# Patient Record
Sex: Male | Born: 1960
Health system: Southern US, Community
[De-identification: ages and names within clinical notes are randomized; demographics above are authoritative.]

## PROBLEM LIST (undated history)

## (undated) DIAGNOSIS — C801 Malignant (primary) neoplasm, unspecified: Secondary | ICD-10-CM

## (undated) DIAGNOSIS — T8859XA Other complications of anesthesia, initial encounter: Secondary | ICD-10-CM

## (undated) DIAGNOSIS — T7840XA Allergy, unspecified, initial encounter: Secondary | ICD-10-CM

## (undated) DIAGNOSIS — F419 Anxiety disorder, unspecified: Secondary | ICD-10-CM

## (undated) DIAGNOSIS — T4145XA Adverse effect of unspecified anesthetic, initial encounter: Secondary | ICD-10-CM

## (undated) HISTORY — DX: Allergy, unspecified, initial encounter: T78.40XA

## (undated) HISTORY — DX: Anxiety disorder, unspecified: F41.9

---

## 2001-11-21 ENCOUNTER — Encounter: Admission: RE | Admit: 2001-11-21 | Discharge: 2001-11-21 | Payer: Self-pay | Admitting: Family Medicine

## 2001-11-21 ENCOUNTER — Encounter: Payer: Self-pay | Admitting: Family Medicine

## 2005-09-27 ENCOUNTER — Emergency Department (HOSPITAL_COMMUNITY): Admission: EM | Admit: 2005-09-27 | Discharge: 2005-09-27 | Payer: Self-pay | Admitting: *Deleted

## 2006-10-31 HISTORY — PX: PROSTATE SURGERY: SHX751

## 2007-03-13 ENCOUNTER — Ambulatory Visit (HOSPITAL_COMMUNITY): Admission: RE | Admit: 2007-03-13 | Discharge: 2007-03-13 | Payer: Self-pay | Admitting: Urology

## 2007-04-18 ENCOUNTER — Encounter (INDEPENDENT_AMBULATORY_CARE_PROVIDER_SITE_OTHER): Payer: Self-pay | Admitting: Urology

## 2007-04-18 ENCOUNTER — Inpatient Hospital Stay (HOSPITAL_COMMUNITY): Admission: RE | Admit: 2007-04-18 | Discharge: 2007-04-19 | Payer: Self-pay | Admitting: Urology

## 2007-10-14 IMAGING — CR DG CHEST 2V
3 series · 3 of 3 positions shown · non-contrast
Comparison: None.

Addendum BeginsOriginal report by Dr. Brutus.  Following addendum by Dr. Sione on 04/18/07:
CLINICAL DATA: Repeat PA chest requested.  The initial chest x-ray on 04/10/07 was not done with deep inspiration.
 PA CHEST ? 04/18/07:
CLINICAL DATA: 46-year-old, prostate cancer, preoperative respiratory exam for prostatectomy.  
 CHEST ? 2 VIEW:

[view not recorded (1 of 3)]
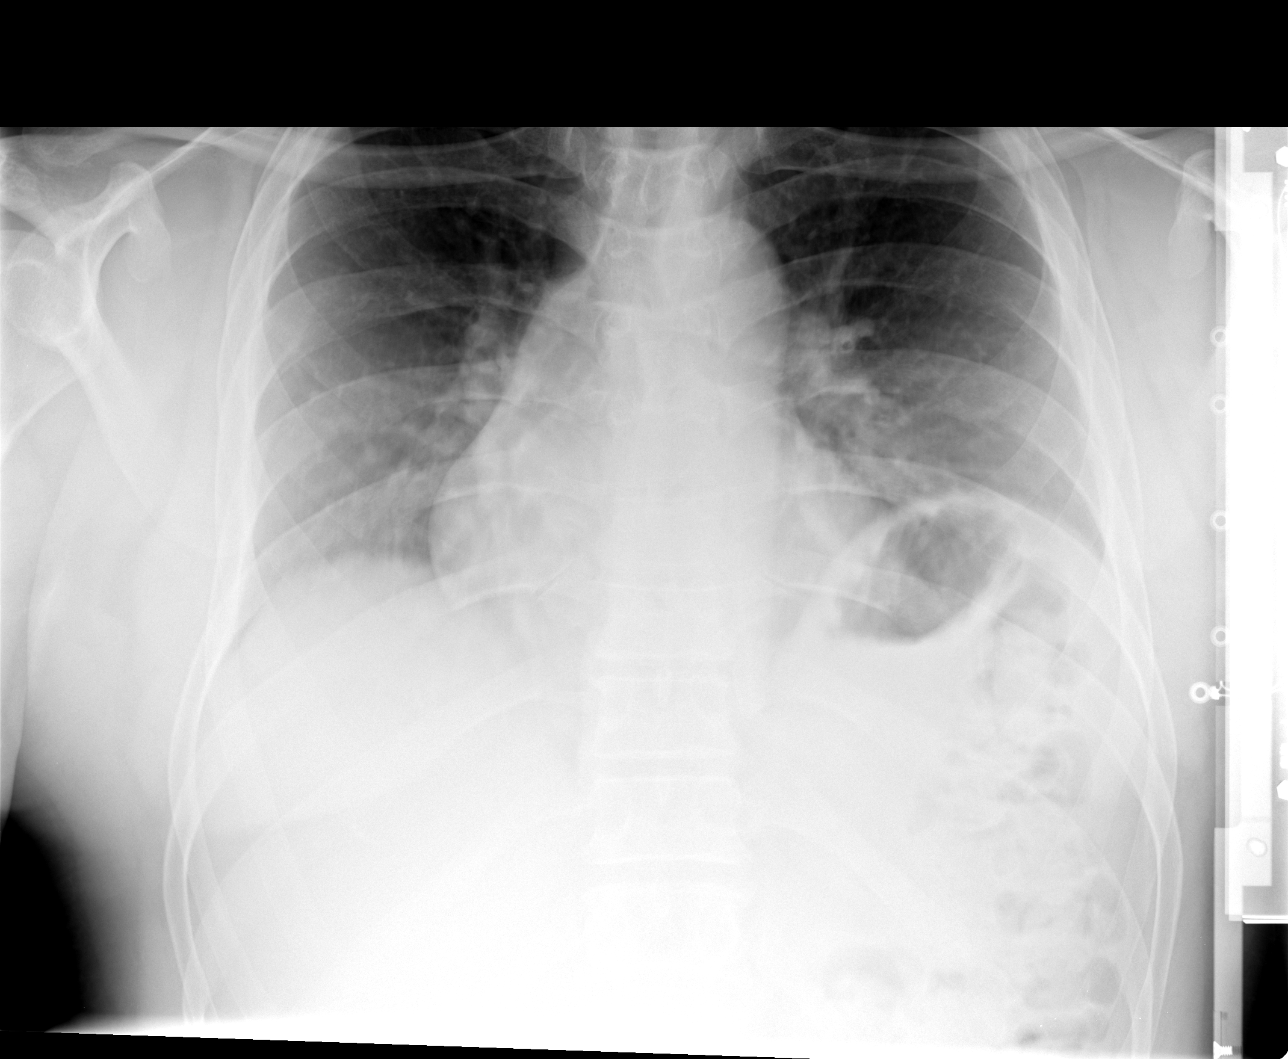

[view not recorded (2 of 3)]
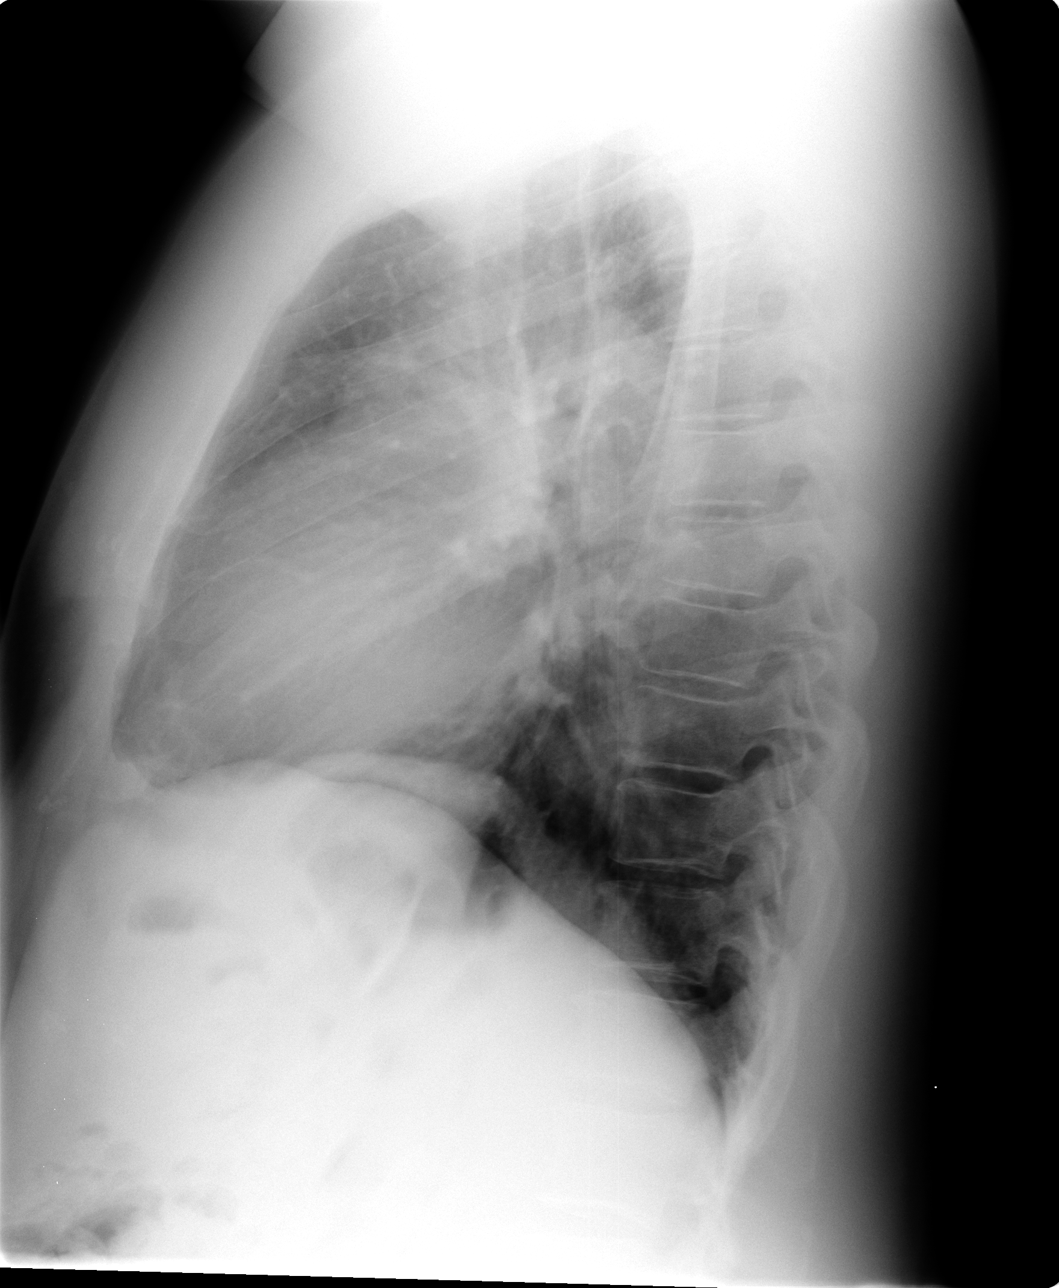

[view not recorded (3 of 3)]
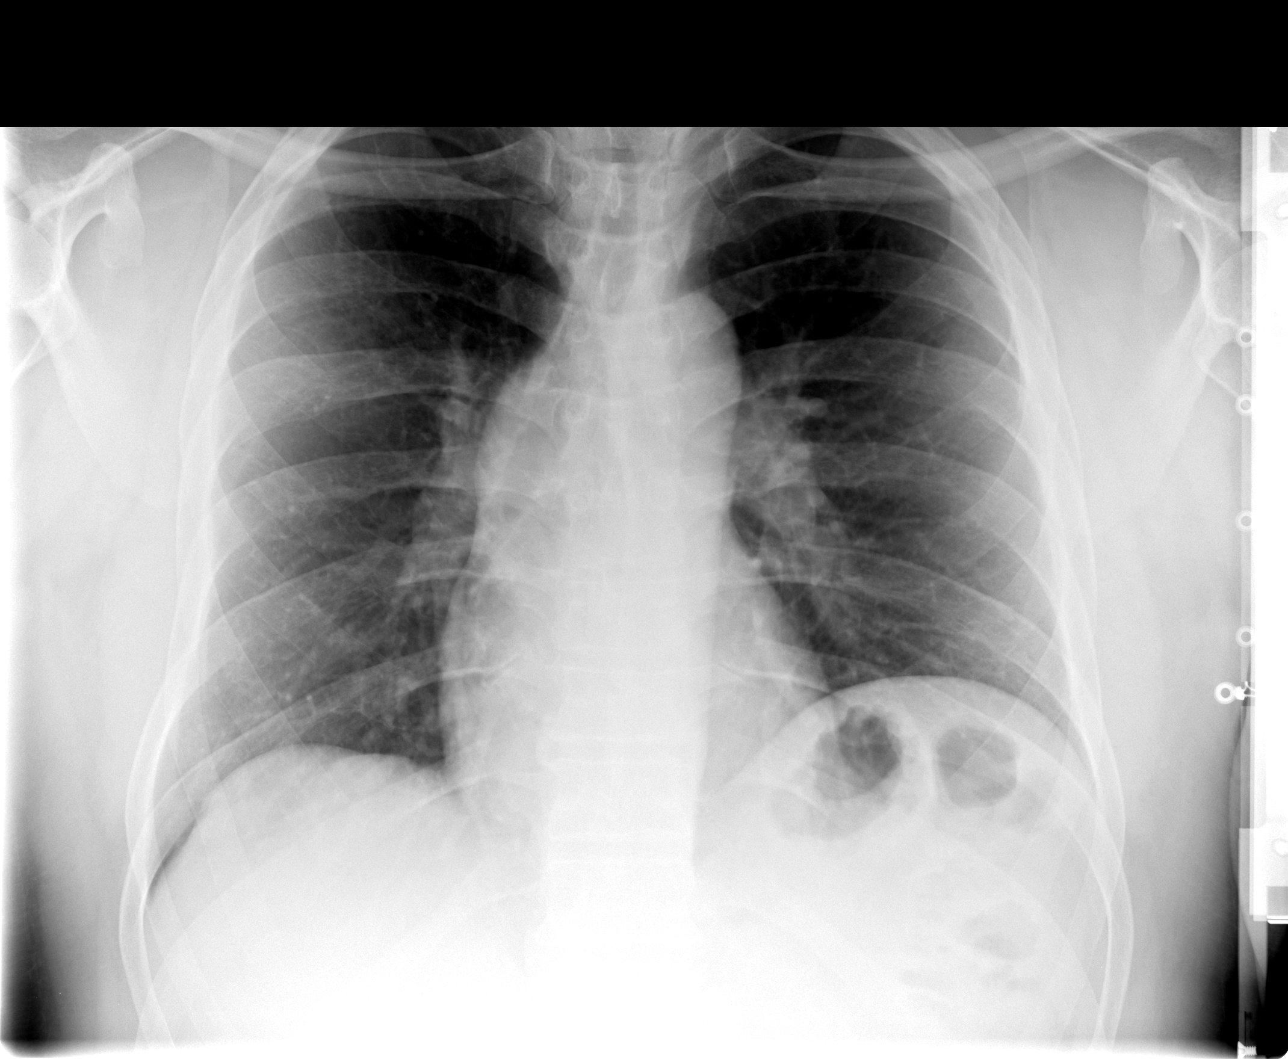

[3 of 3 positions shown; findings below may reference images not displayed]

FINDINGS: Today?s repeat PA chest was done with a better level of inspiration.  The heart and lungs are normal.  Osseous structures intact in one view.
IMPRESSION: No active disease. 

 Addendum Ends
FINDINGS: The PA film is very low volume inspiration and possibly expiration with vascular crowding and accentuation of heart size.  No significant pulmonary findings are seen on the lateral film.  Bony structures are intact.
IMPRESSION: Very low volume, possible expiratory, PA chest film.  It should be repeated.  No obvious acute pulmonary findings or masses.  No abnormalities are seen on the lateral film.

## 2008-11-12 ENCOUNTER — Emergency Department (HOSPITAL_COMMUNITY): Admission: EM | Admit: 2008-11-12 | Discharge: 2008-11-12 | Payer: Self-pay | Admitting: Emergency Medicine

## 2009-10-31 HISTORY — PX: HERNIA REPAIR: SHX51

## 2010-08-27 ENCOUNTER — Encounter (INDEPENDENT_AMBULATORY_CARE_PROVIDER_SITE_OTHER): Payer: Self-pay | Admitting: Surgery

## 2010-08-30 ENCOUNTER — Inpatient Hospital Stay (HOSPITAL_COMMUNITY): Admission: RE | Admit: 2010-08-30 | Discharge: 2010-08-30 | Payer: Self-pay | Admitting: Surgery

## 2011-01-12 LAB — GLUCOSE, CAPILLARY: Glucose-Capillary: 124 mg/dL — ABNORMAL HIGH (ref 70–99)

## 2011-01-12 LAB — BASIC METABOLIC PANEL
BUN: 10 mg/dL (ref 6–23)
BUN: 12 mg/dL (ref 6–23)
BUN: 9 mg/dL (ref 6–23)
CO2: 26 mEq/L (ref 19–32)
Calcium: 9.4 mg/dL (ref 8.4–10.5)
Chloride: 105 mEq/L (ref 96–112)
Chloride: 107 mEq/L (ref 96–112)
Creatinine, Ser: 1.15 mg/dL (ref 0.4–1.5)
Creatinine, Ser: 1.18 mg/dL (ref 0.4–1.5)
GFR calc non Af Amer: >60 mL/min (ref >60)
Glucose, Bld: 105 mg/dL — ABNORMAL HIGH (ref 70–99)
Glucose, Bld: 131 mg/dL — ABNORMAL HIGH (ref 70–99)
Glucose, Bld: 146 mg/dL — ABNORMAL HIGH (ref 70–99)
Potassium: 4.6 mEq/L (ref 3.5–5.1)

## 2011-01-12 LAB — CBC
HCT: 46 % (ref 39.0–52.0)
MCH: 31.5 pg (ref 26.0–34.0)
MCH: 31.5 pg (ref 26.0–34.0)
MCHC: 34.2 g/dL (ref 30.0–36.0)
MCHC: 34.3 g/dL (ref 30.0–36.0)
MCHC: 34.5 g/dL (ref 30.0–36.0)
MCV: 91.5 fL (ref 78.0–100.0)
MCV: 91.8 fL (ref 78.0–100.0)
Platelets: 297 10*3/uL (ref 150–400)
Platelets: 325 10*3/uL (ref 150–400)
RDW: 12.9 % (ref 11.5–15.5)
RDW: 13 % (ref 11.5–15.5)
RDW: 13.3 % (ref 11.5–15.5)
WBC: 4.2 10*3/uL (ref 4.0–10.5)
WBC: 9 10*3/uL (ref 4.0–10.5)

## 2011-01-12 LAB — CARDIAC PANEL(CRET KIN+CKTOT+MB+TROPI)
CK, MB: 2.4 ng/mL (ref 0.3–4.0)
CK, MB: 2.4 ng/mL (ref 0.3–4.0)
Relative Index: 0.7 (ref 0.0–2.5)
Relative Index: 0.9 (ref 0.0–2.5)
Troponin I: 0.01 ng/mL (ref 0.00–0.06)
Troponin I: 0.01 ng/mL (ref 0.00–0.06)

## 2011-01-12 LAB — URINALYSIS, ROUTINE W REFLEX MICROSCOPIC
Bilirubin Urine: NEGATIVE
Glucose, UA: NEGATIVE mg/dL
Ketones, ur: NEGATIVE mg/dL
Protein, ur: NEGATIVE mg/dL

## 2011-01-12 LAB — DIFFERENTIAL
Basophils Absolute: 0 10*3/uL (ref 0.0–0.1)
Basophils Relative: 1 % (ref 0–1)
Eosinophils Absolute: 0.2 10*3/uL (ref 0.0–0.7)
Eosinophils Relative: 5 % (ref 0–5)
Lymphocytes Relative: 45 % (ref 12–46)
Monocytes Absolute: 0.5 10*3/uL (ref 0.1–1.0)

## 2011-01-12 LAB — PROTIME-INR: Prothrombin Time: 13.5 seconds (ref 11.6–15.2)

## 2011-01-12 LAB — CK TOTAL AND CKMB (NOT AT ARMC)
CK, MB: 2.5 ng/mL (ref 0.3–4.0)
Total CK: 341 U/L — ABNORMAL HIGH (ref 7–232)

## 2011-01-12 LAB — SURGICAL PCR SCREEN: MRSA, PCR: NEGATIVE

## 2011-01-12 LAB — TSH: TSH: 0.799 u[IU]/mL (ref 0.350–4.500)

## 2011-02-14 LAB — POCT I-STAT, CHEM 8
Chloride: 103 mEq/L (ref 96–112)
HCT: 43 % (ref 39.0–52.0)
Hemoglobin: 14.6 g/dL (ref 13.0–17.0)
Potassium: 4.1 mEq/L (ref 3.5–5.1)
Sodium: 141 mEq/L (ref 135–145)

## 2011-03-15 NOTE — Discharge Summary (Signed)
[name], [name]               ACCOUNT NO.:  [id number]   MEDICAL RECORD NO.:  [id number]          PATIENT TYPE:  INP   LOCATION:  1405                         FACILITY:  [facility]   PHYSICIAN:  Heloise Purpura, MD      DATE OF BIRTH:  1961/08/05   DATE OF ADMISSION:  04/18/2007  DATE OF DISCHARGE:  04/19/2007                               DISCHARGE SUMMARY   ADMISSION DIAGNOSIS:  Prostate cancer.   DISCHARGE DIAGNOSIS:  Prostate cancer.   PROCEDURE:  1. Robotic assisted laparoscopic radical prostatectomy.  2. Bilateral pelvic lymphadenectomy.   HISTORY AND PHYSICAL:  For full details please see admission history and  physical.  Briefly, Mr. [name] is a 50 year old gentleman with clinical  stage T1-2 prostate cancer with a PSA of 6.1 and Gleason score 3 + 4  equals seven.  His metastatic evaluation was negative and after  discussing management options for clinically localized prostate cancer,  he elected to proceed with the above procedure.   HOSPITAL COURSE:  On 04/18/2007, the patient underwent a robotic  assisted laparoscopic radical prostatectomy.  He tolerated this  procedure well without complications.  Postoperatively, he was able to  be transferred to a regular hospital room following recovery from  anesthesia.  He was able to begin ambulating the night of surgery.  He  remained hemodynamically stable the first evening following surgery and  his hemoglobin remained stable at 13.7 on postoperative day #1.  He  maintained excellent urine output with minimal output from his pelvic  drain.  His pelvic drain was therefore removed.  He was begun on a clear  liquid diet which he tolerated without difficulty and was therefore  transitioned to oral pain medication..  By the afternoon of  postoperative day #1, he had met all discharge criteria and was  discharged home in excellent condition.   DISPOSITION:  Home.   DISCHARGE MEDICATIONS:  The patient was given a prescription to  take  Vicodin as needed for pain, Colace as a stool softener, and Cipro to  begin 1 day prior to his return visit for Foley catheter removal.   DISCHARGE INSTRUCTIONS:  The patient was instructed to be ambulatory but  specifically told to refrain from any heavy lifting, strenuous activity,  or driving.  He was told to gradually advance his diet once passing  flatus.  He was also given instruction on routine Foley catheter care.   FOLLOW UP:  Mr. [name] will follow-up in 1 week for removal of Foley  catheter and to discuss his surgical pathology in detail.           ______________________________  Heloise Purpura, MD  Electronically Signed     LB/MEDQ  D:  04/19/2007  T:  04/19/2007  Job:  [id number]

## 2011-03-15 NOTE — Op Note (Signed)
[name], [name]               ACCOUNT NO.:  [id number]   MEDICAL RECORD NO.:  [id number]          PATIENT TYPE:  INP   LOCATION:  1405                         FACILITY:  [facility]   PHYSICIAN:  Heloise Purpura, MD      DATE OF BIRTH:  1961-06-28   DATE OF PROCEDURE:  04/18/2007  DATE OF DISCHARGE:                               OPERATIVE REPORT   PREOPERATIVE DIAGNOSIS:  Clinically localized adenocarcinoma of  prostate.   POSTOPERATIVE DIAGNOSIS:  Clinically localized adenocarcinoma of  prostate.   PROCEDURES:  1. Robotic assisted laparoscopic radical prostatectomy (bilateral      nerve sparing).  2. Bilateral pelvic lymphadenectomy.   SURGEON:  Dr. Heloise Purpura.   ASSISTANT:  Dr. Terie Purser.   ANESTHESIA:  General.   COMPLICATIONS:  None.   ESTIMATED BLOOD LOSS:  250 mL.   INTRAVENOUS FLUIDS:  1600 mL of lactated Ringer's.   SPECIMENS:  1. Prostate and seminal vesicles.  2. Right pelvic lymph nodes.  3. Left pelvic lymph nodes.   DISPOSITION:  Specimen to pathology.   DRAINS:  1. 20-French straight catheter.  2. #19 Blake pelvic drain.   INDICATIONS:  [name] is a 50 year old gentleman with clinical stage  T1-2 prostate cancer with a PSA of 6.1 Gleason score of 3+4=7.  He did  undergo metastatic evaluation under the care of Dr. Patsi Sears including  a bone scan and CT scan which were negative for metastatic disease.  His  pretreatment IIEF score is 23 with an IPSS 2.  After discussing  management options for clinically localized prostate cancer, the patient  elected to proceed with surgical removal and the procedures above.  Potential risks and benefits as well as complications and alternative  options were discussed with the patient and informed consent was  obtained.   DESCRIPTION OF PROCEDURE:  The patient is taken to the operating room  and a general anesthetic was administered.  He was given preoperative  antibiotics, placed in the dorsal lithotomy  position, prepped and draped  in usual sterile fashion.  Next preoperative time-out was performed.  A  Foley catheter was then inserted into the bladder.  A site was selected  18 cm from the pubic symphysis and just to left the umbilicus for  placement of the camera port.  This was placed using a standard open  Hassan technique.  This allowed entry to the peritoneal cavity under  direct vision without difficulty.  A 12 mm port was then placed and a  pneumoperitoneum was established.  The 0 degree lens was then used to  inspect the abdomen there was no evidence of any intra-abdominal  injuries or other abnormalities.  The remaining ports were then placed.  Bilateral 8 mm robotic ports were placed 10 cm lateral to the camera  port just inferior to the camera port.  An additional 8 mm robotic port  was placed in the far left lateral abdominal wall.  A 5 mm port was  placed between the camera port and the right robotic port and an  additional 12 mm port was placed in the far  right lateral abdominal  port.  All ports were placed under direct vision and without difficulty.  The surgical cart was then docked.  With the aid of cautery scissors,  the bladder was reflected posteriorly allowing entry in the space of  Retzius and identification of the endopelvic fascia and prostate.  The  endopelvic fascia was then incised from the apex back to the base of  prostate bilaterally and the underlying levator muscle fibers were swept  laterally off the prostate.  This isolated the dorsal venous complex  which was then stapled and divided with a 45 mm Flex ETS stapler.  The  bladder neck was then identified with the aid of Foley catheter  manipulation and divided anteriorly.  This allowed exposure of the Foley  catheter and the catheter balloon was deflated.  The catheter was  brought into the operative field and used to retract the prostate  anteriorly.  This exposed the posterior bladder neck which was  then  incised and dissection continued posteriorly between the bladder neck  and prostate until the vasa deferentia and seminal vesicles were  identified.  Vasa deferentia were isolated and divided and then lifted  anteriorly.  Seminal vesicles were dissected down to their tips where  the seminal vesicle arterial blood supply was controlled.  The seminal  vesicles were then lifted anteriorly.  The space between Denonvilliers'  fascia and the anterior rectum was bluntly developed thereby isolating  the vascular pedicles of the prostate.  The lateral prostatic fascia was  incised bilaterally allowing the neurovascular bundles to be swept  laterally and posteriorly off the prostate.  The vascular pedicles of  prostate was then ligated above the level of the neurovascular bundles  with Hem-o-lok clips and divided with sharp cold scissor dissection.  The neurovascular bundles were then swept off the apex of the prostate  and urethra.  The urethra sharply divided allowing the prostate specimen  to be disarticulated.  The pelvis was then copiously irrigated and there  was noted to be a venous bleeding site toward the distal aspect of the  left neurovascular bundle.  A piece of Surgicel was placed over this  area while the lymphadenectomy was performed.  There was no evidence of  a rectal injury and otherwise hemostasis was excellent.  Attention  turned to the right pelvic sidewall and the fibrofatty tissue between  the external iliac vein, confluence of the iliac vessels, internal iliac  artery, and Cooper's ligament was dissected free from the pelvic  sidewall with care to preserve the obturator nerve.  Hem-o-lok clips  were used for lymphostasis and hemostasis.  This lymphatic packet was  then passed off for permanent pathologic analysis.  An identical  procedure was then performed on the contralateral side.  Attention then turned to the urethral anastomosis.  2-0 Vicryl slip-knot was placed   between the posterior urethra, Denonvilliers' fascia and the bladder  neck to reapproximate these structures.  A double-armed 3-0 Monocryl  suture was then used to perform a 360 degrees running tension-free  anastomosis between the bladder neck and urethra.  A 20-French coude  catheter was then inserted into the bladder and irrigated.  There no  blood clots within the bladder and the anastomosis appeared to be  watertight.  A #19 Blake drain was then brought through the left robotic  port appropriately positioned in the pelvis.  It was secured to skin  with a nylon suture.  The surgical cart was then undocked the right  lateral 12 mm port site was closed with a 0-0 Vicryl suture placed with  the aid of the suture passer device.  The prostate specimen was then  removed intact within the Endopouch retrieval bag via the periumbilical  port site.  This fascial opening was closed with a running 0-0 Vicryl  suture.  All port sites were then injected with quarter percent  Marcaine.  The remaining ports had been removed under direct vision  previously.  All  incisions were then closed at the skin level with staples and sterile  dressings were applied.  The patient appeared tolerate the procedure  well without complications.  He was able to be extubated and transferred  to recovery in satisfactory condition.           ______________________________  Heloise Purpura, MD  Electronically Signed     LB/MEDQ  D:  04/18/2007  T:  04/19/2007  Job:  [id number]

## 2011-08-17 LAB — HEMOGLOBIN AND HEMATOCRIT, BLOOD: Hemoglobin: 13.7

## 2011-08-17 LAB — ABO/RH: ABO/RH(D): O POS

## 2011-08-17 LAB — TYPE AND SCREEN
ABO/RH(D): O POS
Antibody Screen: NEGATIVE

## 2011-08-18 LAB — CBC
MCHC: 33.9
MCV: 89.4
Platelets: 295
RDW: 13.3

## 2011-08-18 LAB — BASIC METABOLIC PANEL
CO2: 30
Chloride: 107
GFR calc non Af Amer: >60
Glucose, Bld: 58 — ABNORMAL LOW
Potassium: 3.7
Sodium: 143

## 2014-05-11 ENCOUNTER — Encounter (HOSPITAL_COMMUNITY): Payer: Self-pay | Admitting: Emergency Medicine

## 2014-05-11 ENCOUNTER — Emergency Department (HOSPITAL_COMMUNITY)
Admission: EM | Admit: 2014-05-11 | Discharge: 2014-05-11 | Disposition: A | Discharge status: Home / Self Care | Payer: BC Managed Care – PPO | Attending: Emergency Medicine | Admitting: Emergency Medicine

## 2014-05-11 DIAGNOSIS — Z79899 Other long term (current) drug therapy: Secondary | ICD-10-CM | POA: Insufficient documentation

## 2014-05-11 DIAGNOSIS — R0981 Nasal congestion: Secondary | ICD-10-CM

## 2014-05-11 DIAGNOSIS — J3489 Other specified disorders of nose and nasal sinuses: Secondary | ICD-10-CM | POA: Insufficient documentation

## 2014-05-11 MED ORDER — PSEUDOEPHEDRINE HCL 30 MG/5ML PO SYRP
15.0000 mg | ORAL_SOLUTION | Freq: Three times a day (TID) | ORAL | Status: DC | PRN
Start: 2014-05-11 — End: 2016-01-11

## 2014-05-11 MED ORDER — FLUTICASONE PROPIONATE 50 MCG/ACT NA SUSP: 1.0000 | Freq: Every day | NASAL | Status: DC

## 2014-05-11 NOTE — ED Provider Notes (Signed)
CSN: [id number]     Arrival date & time 05/11/14  8416 History   First MD Initiated Contact with Patient 05/11/14 0559     Chief Complaint  Patient presents with  . Nasal Congestion     (Consider location/radiation/quality/duration/timing/severity/associated sxs/prior Treatment) HPI Comments: Patient presents to the emergency department with chief complaint of nasal congestion. He states that the congestion started last night. He complains of difficulty breathing through his nose. He denies any fevers, chills, nausea, vomiting, chest pain, shortness of breath, or abdominal pain. He has tried taking allergy medicine with no relief. There are no aggravating or alleviating factors. He denies any sick contacts.  The history is provided by the patient. No language interpreter was used.    History reviewed. No pertinent past medical history. History reviewed. No pertinent past surgical history. No family history on file. History  Substance Use Topics  . Smoking status: Never Smoker   . Smokeless tobacco: Not on file  . Alcohol Use: No    Review of Systems  Constitutional: Negative for fever and chills.  HENT: Positive for postnasal drip and sinus pressure.   Respiratory: Negative for shortness of breath.   Cardiovascular: Negative for chest pain.  Gastrointestinal: Negative for nausea, vomiting, diarrhea and constipation.  Genitourinary: Negative for dysuria.  All other systems reviewed and are negative.     Allergies  Review of patient's allergies indicates no known allergies.  Home Medications   Prior to Admission medications   Medication Sig Start Date End Date Taking? Authorizing Provider  Chlorphen-Pseudoephed-APAP (TYLENOL ALLERGY COMPLETE PO) Take 2 tablets by mouth daily as needed (cold).   Yes Historical Provider, MD   BP 137/97  Pulse 63  Temp(Src) 97.7 F (36.5 C)  Resp 19  Ht 6\' 3"  (1.905 m)  Wt 254 lb (115.214 kg)  BMI 31.75 kg/m2  SpO2 98% Physical Exam   Nursing note and vitals reviewed. Constitutional: He is oriented to person, place, and time. He appears well-developed and well-nourished.  HENT:  Head: Normocephalic and atraumatic.  No sinus pressure, oropharynx clear, no exudates, bilateral cerumen impaction  Eyes: Conjunctivae and EOM are normal. Pupils are equal, round, and reactive to light. Right eye exhibits no discharge. Left eye exhibits no discharge. No scleral icterus.  Neck: Normal range of motion. Neck supple. No JVD present.  Cardiovascular: Normal rate, regular rhythm and normal heart sounds.  Exam reveals no gallop and no friction rub.   No murmur heard. Pulmonary/Chest: Effort normal and breath sounds normal. No respiratory distress. He has no wheezes. He has no rales. He exhibits no tenderness.  CTAB  Abdominal: Soft. He exhibits no distension and no mass. There is no tenderness. There is no rebound and no guarding.  No focal abdominal tenderness, no RLQ tenderness or pain at McBurney's point, no RUQ tenderness or Murphy's sign, no left-sided abdominal tenderness, no fluid wave, or signs of peritonitis   Musculoskeletal: Normal range of motion. He exhibits no edema and no tenderness.  Neurological: He is alert and oriented to person, place, and time.  Skin: Skin is warm and dry.  Psychiatric: He has a normal mood and affect. His behavior is normal. Judgment and thought content normal.    ED Course  Procedures (including critical care time) Labs Review Labs Reviewed - No data to display  Imaging Review No results found.   EKG Interpretation None      MDM   Final diagnoses:  Nasal congestion    Patient with nasal  congestion. No fevers. Lungs are clear. Not in any apparent distress. Will try nasal decongestant and Flonase.  Encouraged careful monitoring of BP while taking sudafed. DC home.  Filed Vitals:   05/11/14 0618  BP: 137/97  Pulse: 63  Temp:   Resp: [address], [place] 05/11/14  [phone]

## 2014-05-11 NOTE — ED Notes (Signed)
The pt is c/o nasal congestion and chest congestion since 2200 .  He reports he cannot breathe through his nose

## 2014-05-11 NOTE — Discharge Instructions (Signed)
Sudafed may cause slight to moderate increase in blood pressure.  Please monitor your blood pressure carefully.  Do not take the sudafed for more than 5 days.  If you notice a significant increase in your blood pressure, please discontinue use of the sudafed.  Upper Respiratory Infection, Adult An upper respiratory infection (URI) is also sometimes known as the common cold. The upper respiratory tract includes the nose, sinuses, throat, trachea, and bronchi. Bronchi are the airways leading to the lungs. Most people improve within 1 week, but symptoms can last up to 2 weeks. A residual cough may last even longer.  CAUSES Many different viruses can infect the tissues lining the upper respiratory tract. The tissues become irritated and inflamed and often become very moist. Mucus production is also common. A cold is contagious. You can easily spread the virus to others by oral contact. This includes kissing, sharing a glass, coughing, or sneezing. Touching your mouth or nose and then touching a surface, which is then touched by another person, can also spread the virus. SYMPTOMS  Symptoms typically develop 1 to 3 days after you come in contact with a cold virus. Symptoms vary from person to person. They may include:  Runny nose.  Sneezing.  Nasal congestion.  Sinus irritation.  Sore throat.  Loss of voice (laryngitis).  Cough.  Fatigue.  Muscle aches.  Loss of appetite.  Headache.  Low-grade fever. DIAGNOSIS  You might diagnose your own cold based on familiar symptoms, since most people get a cold 2 to 3 times a year. Your caregiver can confirm this based on your exam. Most importantly, your caregiver can check that your symptoms are not due to another disease such as strep throat, sinusitis, pneumonia, asthma, or epiglottitis. Blood tests, throat tests, and X-rays are not necessary to diagnose a common cold, but they may sometimes be helpful in excluding other more serious diseases.  Your caregiver will decide if any further tests are required. RISKS AND COMPLICATIONS  You may be at risk for a more severe case of the common cold if you smoke cigarettes, have chronic heart disease (such as heart failure) or lung disease (such as asthma), or if you have a weakened immune system. The very young and very old are also at risk for more serious infections. Bacterial sinusitis, middle ear infections, and bacterial pneumonia can complicate the common cold. The common cold can worsen asthma and chronic obstructive pulmonary disease (COPD). Sometimes, these complications can require emergency medical care and may be life-threatening. PREVENTION  The best way to protect against getting a cold is to practice good hygiene. Avoid oral or hand contact with people with cold symptoms. Wash your hands often if contact occurs. There is no clear evidence that vitamin C, vitamin E, echinacea, or exercise reduces the chance of developing a cold. However, it is always recommended to get plenty of rest and practice good nutrition. TREATMENT  Treatment is directed at relieving symptoms. There is no cure. Antibiotics are not effective, because the infection is caused by a virus, not by bacteria. Treatment may include:  Increased fluid intake. Sports drinks offer valuable electrolytes, sugars, and fluids.  Breathing heated mist or steam (vaporizer or shower).  Eating chicken soup or other clear broths, and maintaining good nutrition.  Getting plenty of rest.  Using gargles or lozenges for comfort.  Controlling fevers with ibuprofen or acetaminophen as directed by your caregiver.  Increasing usage of your inhaler if you have asthma. Zinc gel and zinc lozenges,  taken in the first 24 hours of the common cold, can shorten the duration and lessen the severity of symptoms. Pain medicines may help with fever, muscle aches, and throat pain. A variety of non-prescription medicines are available to treat  congestion and runny nose. Your caregiver can make recommendations and may suggest nasal or lung inhalers for other symptoms.  HOME CARE INSTRUCTIONS   Only take over-the-counter or prescription medicines for pain, discomfort, or fever as directed by your caregiver.  Use a warm mist humidifier or inhale steam from a shower to increase air moisture. This may keep secretions moist and make it easier to breathe.  Drink enough water and fluids to keep your urine clear or pale yellow.  Rest as needed.  Return to work when your temperature has returned to normal or as your caregiver advises. You may need to stay home longer to avoid infecting others. You can also use a face mask and careful hand washing to prevent spread of the virus. SEEK MEDICAL CARE IF:   After the first few days, you feel you are getting worse rather than better.  You need your caregiver's advice about medicines to control symptoms.  You develop chills, worsening shortness of breath, or brown or red sputum. These may be signs of pneumonia.  You develop yellow or brown nasal discharge or pain in the face, especially when you bend forward. These may be signs of sinusitis.  You develop a fever, swollen neck glands, pain with swallowing, or white areas in the back of your throat. These may be signs of strep throat. SEEK IMMEDIATE MEDICAL CARE IF:   You have a fever.  You develop severe or persistent headache, ear pain, sinus pain, or chest pain.  You develop wheezing, a prolonged cough, cough up blood, or have a change in your usual mucus (if you have chronic lung disease).  You develop sore muscles or a stiff neck. Document Released: 04/12/2001 Document Revised: 01/09/2012 Document Reviewed: 02/18/2011 [facility] Patient Information 2015 [place], [place]. This information is not intended to replace advice given to you by your health care provider. Make sure you discuss any questions you have with your health care  provider. Pseudoephedrine oral solution or syrup What is this medicine? PSEUDOEPHEDRINE (soo doe e FED rin) is a decongestant. It is used to treat congestion of the nose or sinuses. This medicine may be used for other purposes; ask your health care provider or pharmacist if you have questions. COMMON BRAND NAME(S): ElixSure Cold, ElixSure Congestion, Myfedrine, NASAL Decongestant, Silfedrine, Sudafed Children's, Sudafed Children's Nasal Decongestant, Sudogest Children's, Tylenol Children's Simply Stuffy What should I tell my health care provider before I take this medicine? They need to know if you have any of the following conditions: -diabetes -glaucoma -heart disease -high blood pressure -kidney disease -prostate trouble -taken an MAOI like Carbex, Eldepryl, Marplan, Nardil, or Parnate in last 14 days -thyroid disease -trouble passing urine -an unusual or allergic reaction to pseudoephedrine, other medicines, foods, dyes, or preservatives -pregnant or trying to get pregnant -breast-feeding How should I use this medicine? Take this medicine by mouth. Follow the directions on the prescription label. Use a specially marked spoon or container to measure each dose. Ask your pharmacist if you do not have one. Household spoons are not accurate. Take your medicine at regular intervals. Do not take your medicine more often than directed. Talk to your pediatrician regarding the use of this medicine in children. While this drug may be prescribed for children as  young as 74 years old for selected conditions, precautions do apply. Patients over 72 years old may have a stronger reaction and need a smaller dose. Overdosage: If you think you have taken too much of this medicine contact a poison control center or emergency room at once. NOTE: This medicine is only for you. Do not share this medicine with others. What if I miss a dose? If you miss a dose, take it as soon as you can. If it is almost time  for your next dose, take only that dose. Do not take double or extra doses. What may interact with this medicine? Do not take this medicine with any of the following medications: -bromocriptine -ergot alkaloids like dihydroergotamine, ergonovine, ergotamine, methylergonovine -MAOIs like Carbex, Eldepryl, Marplan, Nardil, and Parnate -stimulant medicines for attention disorders, weight loss, or to stay awake This medicine may also interact with the following medications: -alcohol -atropine -bretylium -caffeine -digoxin -linezolid -mecamylamine -medicines for blood pressure -medicines for depression, anxiety, or psychotic disturbances like fluoxetine, sertraline -medicines for enlarged prostate -medicines for sleep -other medicines for cold, cough, or allergy -procarbazine -reserpine -some heart medicines like metoprolol -St. John's Wort This list may not describe all possible interactions. Give your health care provider a list of all the medicines, herbs, non-prescription drugs, or dietary supplements you use. Also tell them if you smoke, drink alcohol, or use illegal drugs. Some items may interact with your medicine. What should I watch for while using this medicine? Tell your doctor or healthcare professional if your symptoms do not start to get better or if they get worse. See your doctor if you are not better in 7 days or if you have a fever. What side effects may I notice from receiving this medicine? Side effects that you should report to your doctor or health care professional as soon as possible: -allergic reactions like skin rash, itching or hives, swelling of the face, lips, or tongue -bloody diarrhea with stomach pain -breathing problems -chest pain -confused, agitated, nervous -fast, irregular heartbeat -feeling faint or lightheaded, falls -hallucinations -high blood pressure -pain, tingling, numbness in the hands or feet -trouble passing urine or change in the  amount of urine -trouble sleeping Side effects that usually do not require medical attention (report to your doctor or health care professional if they continue or are bothersome): -headache -loss of appetite -nausea, stomach upset This list may not describe all possible side effects. Call your doctor for medical advice about side effects. You may report side effects to FDA at 1-800-FDA-1088. Where should I keep my medicine? Keep out of the reach of children. Store at room temperature between 15 and 25 degrees C (59 and 77 degrees F). Protect from heat and moisture. Throw away any unused medicine after the expiration date. NOTE: This sheet is a summary. It may not cover all possible information. If you have questions about this medicine, talk to your doctor, pharmacist, or health care provider.  2015, [employer]. (2008-05-19 15:40:52)

## 2014-05-11 NOTE — ED Notes (Signed)
Pt alert and oriented at discharge.  Pt refused to be taking to the waiting room in a wheelchair, ambulatory with no assistance at discharge.  Family driving pt home.

## 2014-05-19 NOTE — ED Provider Notes (Signed)
Medical screening examination/treatment/procedure(s) were performed by non-physician practitioner and as supervising physician I was immediately available for consultation/collaboration.   EKG Interpretation None        Elyn Peers, MD 05/19/14 [phone]

## 2014-11-04 ENCOUNTER — Ambulatory Visit: Admission: RE | Admit: 2014-11-04 | Payer: BC Managed Care – PPO | Source: Ambulatory Visit

## 2014-11-04 ENCOUNTER — Other Ambulatory Visit: Payer: Self-pay | Admitting: Internal Medicine

## 2014-11-04 DIAGNOSIS — M545 Low back pain, unspecified: Secondary | ICD-10-CM

## 2014-11-04 DIAGNOSIS — IMO0001 Reserved for inherently not codable concepts without codable children: Secondary | ICD-10-CM

## 2014-11-05 ENCOUNTER — Other Ambulatory Visit: Payer: Self-pay | Admitting: Internal Medicine

## 2014-11-05 ENCOUNTER — Ambulatory Visit
Admission: RE | Admit: 2014-11-05 | Discharge: 2014-11-05 | Disposition: A | Discharge status: Home / Self Care | Payer: BC Managed Care – PPO | Source: Ambulatory Visit | Attending: Internal Medicine | Admitting: Internal Medicine

## 2014-11-05 DIAGNOSIS — M545 Low back pain, unspecified: Secondary | ICD-10-CM

## 2014-11-05 DIAGNOSIS — M546 Pain in thoracic spine: Secondary | ICD-10-CM

## 2014-11-05 DIAGNOSIS — M542 Cervicalgia: Secondary | ICD-10-CM

## 2014-11-05 DIAGNOSIS — IMO0001 Reserved for inherently not codable concepts without codable children: Secondary | ICD-10-CM
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
## 2015-12-07 ENCOUNTER — Other Ambulatory Visit: Payer: Self-pay | Admitting: Surgery

## 2015-12-07 NOTE — H&P (Signed)
[name] 12/07/2015 3:48 PM Location: [facility] Patient #: [id number] DOB: Oct 27, 1961 Married / Language: English / Race: Black or African American Male  History of Present Illness Curtis Hector MD; 12/07/2015 5:24 PM) Patient words: RIH.  The patient is a 55 year old male who presents with an inguinal hernia. Note for "Inguinal hernia": Patient sent for surgical consultation by [facility] for concern of RIGHT inguinal hernia.  Pleasant active male. Status post robotic prostatectomy in 2008. Also open RIGHT inguinal hernia repair with mesh and open umbilical hernia repair with mesh by Dr. Harlow Asa October 2011. Laparoscopic camera by new onset atrial fibrillation that required amiodarone infusion. Follow by Dr. Terrence Dupont. Apparently that is now resolved and he is no longer any anticoagulation aside from aspirin. Sounds like he may tend to declined aggressive medications or interventions.  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 No groin pop after doing some heavy lifting. It is been small and reducible until more recently. Its become larger and more painful. Point where it hurts to lift after an hour at work. He has a bowel movement every day. Normally can walk a half hour without difficulty the RIGHT groin. It has started to bother him much more. Because of that, he wished to reconsider surgical intervention. His wife agreed.   Other Problems [name], CMA; 12/07/2015 3:48 PM) Inguinal Hernia Prostate Cancer Umbilical Hernia Repair  Past Surgical History [name], CMA; 12/07/2015 3:48 PM) Laparoscopic Inguinal Hernia Surgery Left. Prostate Surgery - Removal TURP  Diagnostic Studies History [name], CMA; 12/07/2015 3:48 PM) Colonoscopy never  Allergies [name], CMA; 12/07/2015 3:49 PM) No Known Drug Allergies 12/07/2015  Medication History [name], CMA; 12/07/2015  3:49 PM) No Current Medications Medications Reconciled  Social History [name], CMA; 12/07/2015 3:48 PM) Alcohol use Remotely quit alcohol use. Caffeine use Carbonated beverages, Coffee, Tea. No drug use Tobacco use Never smoker.  Family History [name], [place]; 12/07/2015 3:48 PM) Alcohol Abuse Father. Breast Cancer Mother.     Review of Systems [name] CMA; 12/07/2015 3:48 PM) General Not Present- Appetite Loss, Chills, Fatigue, Fever, Night Sweats, Weight Gain and Weight Loss. Skin Not Present- Change in Wart/Mole, Dryness, Hives, Jaundice, New Lesions, Non-Healing Wounds, Rash and Ulcer. HEENT Present- Seasonal Allergies. Not Present- Earache, Hearing Loss, Hoarseness, Nose Bleed, Oral Ulcers, Ringing in the Ears, Sinus Pain, Sore Throat, Visual Disturbances, Wears glasses/contact lenses and Yellow Eyes. Respiratory Not Present- Bloody sputum, Chronic Cough, Difficulty Breathing, Snoring and Wheezing. Breast Not Present- Breast Mass, Breast Pain, Nipple Discharge and Skin Changes. Cardiovascular Not Present- Chest Pain, Difficulty Breathing Lying Down, Leg Cramps, Palpitations, Rapid Heart Rate, Shortness of Breath and Swelling of Extremities. Gastrointestinal Not Present- Abdominal Pain, Bloating, Bloody Stool, Change in Bowel Habits, Chronic diarrhea, Constipation, Difficulty Swallowing, Excessive gas, Gets full quickly at meals, Hemorrhoids, Indigestion, Nausea, Rectal Pain and Vomiting. Male Genitourinary Not Present- Blood in Urine, Change in Urinary Stream, Frequency, Impotence, Nocturia, Painful Urination, Urgency and Urine Leakage. Musculoskeletal Not Present- Back Pain, Joint Pain, Joint Stiffness, Muscle Pain, Muscle Weakness and Swelling of Extremities. Neurological Not Present- Decreased Memory, Fainting, Headaches, Numbness, Seizures, Tingling, Tremor, Trouble walking and Weakness. Psychiatric Not Present- Anxiety, Bipolar, Change in Sleep Pattern,  Depression, Fearful and Frequent crying. Endocrine Not Present- Cold Intolerance, Excessive Hunger, Hair Changes, Heat Intolerance, Hot flashes and New Diabetes. Hematology Not Present- Easy Bruising, Excessive bleeding, Gland problems, HIV  and Persistent Infections.  Vitals ([name] CMA; 12/07/2015 3:49 PM) 12/07/2015 3:49 PM Weight: 271 lb Height: 75in Body Surface Area: 2.5 m Body Mass Index: 33.87 kg/m  Pulse: 83 (Regular)  BP: 164/82 (Sitting, Left Arm, Standard)      Physical Exam Curtis Hector MD; 12/07/2015 4:46 PM)  General Mental Status-Alert. General Appearance-Not in acute distress, Not Sickly. Orientation-Oriented X3. Hydration-Well hydrated. Voice-Normal.  Integumentary Global Assessment Upon inspection and palpation of skin surfaces of the - Axillae: non-tender, no inflammation or ulceration, no drainage. and Distribution of scalp and body hair is normal. General Characteristics Temperature - normal warmth is noted.  Head and Neck Head-normocephalic, atraumatic with no lesions or palpable masses. Face Global Assessment - atraumatic, no absence of expression. Neck Global Assessment - no abnormal movements, no bruit auscultated on the right, no bruit auscultated on the left, no decreased range of motion, non-tender. Trachea-midline. Thyroid Gland Characteristics - non-tender.  Eye Eyeball - Left-Extraocular movements intact, No Nystagmus. Eyeball - Right-Extraocular movements intact, No Nystagmus. Cornea - Left-No Hazy. Cornea - Right-No Hazy. Sclera/Conjunctiva - Left-No scleral icterus, No Discharge. Sclera/Conjunctiva - Right-No scleral icterus, No Discharge. Pupil - Left-Direct reaction to light normal. Pupil - Right-Direct reaction to light normal.  ENMT Ears Pinna - Left - no drainage observed, no generalized tenderness observed. Right - no drainage observed, no generalized tenderness observed. Nose and  Sinuses External Inspection of the Nose - no destructive lesion observed. Inspection of the nares - Left - quiet respiration. Right - quiet respiration. Mouth and Throat Lips - Upper Lip - no fissures observed, no pallor noted. Lower Lip - no fissures observed, no pallor noted. Nasopharynx - no discharge present. Oral Cavity/Oropharynx - Tongue - no dryness observed. Oral Mucosa - no cyanosis observed. Hypopharynx - no evidence of airway distress observed.  Chest and Lung Exam Inspection Movements - Normal and Symmetrical. Accessory muscles - No use of accessory muscles in breathing. Palpation Palpation of the chest reveals - Non-tender. Auscultation Breath sounds - Normal and Clear.  Cardiovascular Auscultation Rhythm - Regular. Murmurs & Other Heart Sounds - Auscultation of the heart reveals - No Murmurs and No Systolic Clicks.  Abdomen Inspection Inspection of the abdomen reveals - No Visible peristalsis and No Abnormal pulsations. Umbilicus - No Bleeding, No Urine drainage. Palpation/Percussion Palpation and Percussion of the abdomen reveal - Soft, Non Tender, No Rebound tenderness, No Rigidity (guarding) and No Cutaneous hyperesthesia. Note: Overweight but soft. No diastases. Infraumbilical transverse incision consistent with open hernia repair with mesh.  Male Genitourinary Sexual Maturity Tanner 5 - Adult hair pattern and Adult penile size and shape. Note: Definite RIGHT groin bulge to upper scrotum consistent with moderate RIGHT inguinal hernia. Reducible. Bowel sounds. Impulse at LEFT groin suspicious for possible recurrent LEFT inguinal hernia as well versus old spermatic cord lipoma.  Testes, epididymides, spermatic cords were within normal limits. Circumcised.  Peripheral Vascular Upper Extremity Inspection - Left - No Cyanotic nailbeds, Not Ischemic. Right - No Cyanotic nailbeds, Not Ischemic.  Neurologic Neurologic evaluation reveals -normal attention span and  ability to concentrate, able to name objects and repeat phrases. Appropriate fund of knowledge , normal sensation and normal coordination. Mental Status Affect - not angry, not paranoid. Cranial Nerves-Normal Bilaterally. Gait-Normal.  Neuropsychiatric Mental status exam performed with findings of-able to articulate well with normal speech/language, rate, volume and coherence, thought content normal with ability to perform basic computations and apply abstract reasoning and no evidence of hallucinations, delusions, obsessions or homicidal/suicidal  ideation.  Musculoskeletal Global Assessment Spine, Ribs and Pelvis - no instability, subluxation or laxity. Right Upper Extremity - no instability, subluxation or laxity.  Lymphatic Head & Neck  General Head & Neck Lymphatics: Bilateral - Description - No Localized lymphadenopathy. Axillary  General Axillary Region: Bilateral - Description - No Localized lymphadenopathy. Femoral & Inguinal  Generalized Femoral & Inguinal Lymphatics: Left - Description - No Localized lymphadenopathy. Right - Description - No Localized lymphadenopathy.    Assessment & Plan Curtis Hector MD; 12/07/2015 5:07 PM)  RIGHT INGUINAL HERNIA (K40.90) Impression: Definite RIGHT inguinal hernia going down to Scrotum. Some bulging on the LEFT side suspicious for recurrent LEFT inguinal hernia as well.  I think he would benefit from surgical repair. Ideally would do laparoscopic underlay repair with extra sheets of mesh to minimize recurrence in this very active large man. Challenge will be that he has had prior mesh repairs in is that prior robotic prostatectomy, so may need to convert to open procedure with mesh. Most likely will have to be a TAPP type repair since he has mesh at his umbilicus. We will see.  It is hurting on a daily basis with his heavy lifting requirements. Nothing is reasonable to downshift him to light duty only for now. 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 He skeptical there is any other options so he most likely have to be off work until he can get repaired . Given his heavy difficulty requirements and sensitivity, I expect 6 weeks postop recovery before returning to unrestricted activity. Trying get this scheduled in a reasonable time since it is increasingly symptomatic now on a daily basis and affects his ability to do his job.  ENCOUNTER FOR PRE-OP - GROIN HERNIA (Z01.818)  Current Plans You are being scheduled for surgery - Our schedulers will call you.  You should hear from our office's scheduling department within 5 working days about the location, date, and time of surgery. We try to make accommodations for patient's preferences in scheduling surgery, but sometimes the OR schedule or the surgeon's schedule prevents [place] from making those accommodations.  If you have not heard from our office [phone]) in 5 working days, call the office and ask for your surgeon's nurse.  If you have other questions about your diagnosis, plan, or surgery, call the office and ask for your surgeon's nurse.  Pt Education - Pamphlet Given - Laparoscopic Hernia Repair: discussed with patient and provided information. The anatomy & physiology of the abdominal wall and pelvic floor was discussed. The pathophysiology of hernias in the inguinal and pelvic region was discussed. Natural history risks such as progressive enlargement, pain, incarceration, and strangulation was discussed. Contributors to complications such as smoking, obesity, diabetes, prior surgery, etc were discussed.  I feel the risks of no intervention will lead to serious problems that outweigh the operative risks; therefore, I recommended surgery to reduce and repair the hernia. I explained laparoscopic techniques with possible need for an open approach. I noted usual use of mesh to patch and/or buttress hernia repair  Risks such as bleeding,  infection, abscess, need for further treatment, heart attack, death, and other risks were discussed. I noted a good likelihood this will help address the problem. Goals of post-operative recovery were discussed as well. Possibility that this will not correct all symptoms was explained. I stressed the importance of low-impact activity, aggressive pain control, avoiding constipation, & not pushing through pain to minimize risk of post-operative chronic  pain or injury. Possibility of reherniation was discussed. We will work to minimize complications.  An educational handout further explaining the pathology & treatment options was given as well. Questions were answered. The patient expresses understanding & wishes to proceed with surgery.  Pt Education - [facility] Hernia Post-Op HCI ([name]): discussed with patient and provided information. Pt Education - [facility] Pain Control ([name])  [name], M.D., F.A.C.S. Gastrointestinal and Minimally Invasive Surgery [facility] [address] [place], [place] [address] [phone] Main / Paging

## 2016-01-06 ENCOUNTER — Other Ambulatory Visit (HOSPITAL_COMMUNITY): Payer: Self-pay | Admitting: *Deleted

## 2016-01-06 ENCOUNTER — Encounter (HOSPITAL_COMMUNITY): Payer: Self-pay

## 2016-01-06 ENCOUNTER — Encounter (HOSPITAL_COMMUNITY)
Admission: RE | Admit: 2016-01-06 | Discharge: 2016-01-06 | Disposition: A | Discharge status: Home / Self Care | Payer: BC Managed Care – PPO | Source: Ambulatory Visit | Attending: Surgery | Admitting: Surgery

## 2016-01-06 DIAGNOSIS — Z01812 Encounter for preprocedural laboratory examination: Secondary | ICD-10-CM | POA: Insufficient documentation

## 2016-01-06 DIAGNOSIS — Z8546 Personal history of malignant neoplasm of prostate: Secondary | ICD-10-CM | POA: Diagnosis not present

## 2016-01-06 DIAGNOSIS — K4090 Unilateral inguinal hernia, without obstruction or gangrene, not specified as recurrent: Secondary | ICD-10-CM | POA: Insufficient documentation

## 2016-01-06 DIAGNOSIS — Z01818 Encounter for other preprocedural examination: Secondary | ICD-10-CM | POA: Diagnosis not present

## 2016-01-06 DIAGNOSIS — I498 Other specified cardiac arrhythmias: Secondary | ICD-10-CM | POA: Diagnosis not present

## 2016-01-06 HISTORY — DX: Adverse effect of unspecified anesthetic, initial encounter: T41.45XA

## 2016-01-06 HISTORY — DX: Other complications of anesthesia, initial encounter: T88.59XA

## 2016-01-06 HISTORY — DX: Malignant (primary) neoplasm, unspecified: C80.1

## 2016-01-06 LAB — CBC
HCT: 41.8 % (ref 39.0–52.0)
Hemoglobin: 14.2 g/dL (ref 13.0–17.0)
MCH: 30.3 pg (ref 26.0–34.0)
MCHC: 34 g/dL (ref 30.0–36.0)
MCV: 89.3 fL (ref 78.0–100.0)
PLATELETS: 250 10*3/uL (ref 150–400)
RBC: 4.68 MIL/uL (ref 4.22–5.81)
RDW: 12.8 % (ref 11.5–15.5)
WBC: 4.2 10*3/uL (ref 4.0–10.5)

## 2016-01-06 NOTE — Pre-Procedure Instructions (Addendum)
Kellen Cozart  01/06/2016      [id number] [address], [place] [address] [phone] [phone]    Your procedure is scheduled on Monday, January 11, 2016    Report to [facility] Entrance "A" Admitting Office at 11:30 AM.   Call this number if you have problems the morning of surgery: [phone]   Any questions prior to day of surgery, please call [phone] between 8 & 4 PM.   Remember:  Do not eat food or drink liquids after midnight Sunday, 01/10/16.  Stop all medications that have Pseudoephedrine in them as of today.(tylenol allergy, sudafed) STOP all herbel meds, nsaids (aleve,naproxen,advil,ibuprofen) Today including aspirin, vitamins    Do not wear jewelry.  Do not wear lotions, powders, or cologne.  You may wear deodorant.  Men may shave face and neck.  Do not bring valuables to the hospital.  [facility] is not responsible for any belongings or valuables.  Contacts, dentures or bridgework may not be worn into surgery.  Leave your suitcase in the car.  After surgery it may be brought to your room.  For patients admitted to the hospital, discharge time will be determined by your treatment team.  Patients discharged the day of surgery will not be allowed to drive home.   Special instructions:   Special Instructions: [facility] - Preparing for Surgery  Before surgery, you can play an important role.  Because skin is not sterile, your skin needs to be as free of germs as possible.  You can reduce the number of germs on you skin by washing with CHG (chlorahexidine gluconate) soap before surgery.  CHG is an antiseptic cleaner which kills germs and bonds with the skin to continue killing germs even after washing.  Please DO NOT use if you have an allergy to CHG or antibacterial soaps.  If your skin becomes reddened/irritated stop using the CHG and inform your nurse when you arrive at Short  Stay.  Do not shave (including legs and underarms) for at least 48 hours prior to the first CHG shower.  You may shave your face.  Please follow these instructions carefully:   1.  Shower with CHG Soap the night before surgery and the morning of Surgery.  2.  If you choose to wash your hair, wash your hair first as usual with your normal shampoo.  3.  After you shampoo, rinse your hair and body thoroughly to remove the Shampoo.  4.  Use CHG as you would any other liquid soap.  You can apply chg directly  to the skin and wash gently with scrungie or a clean washcloth.  5.  Apply the CHG Soap to your body ONLY FROM THE NECK DOWN.  Do not use on open wounds or open sores.  Avoid contact with your eyes ears, mouth and genitals (private parts).  Wash genitals (private parts)       with your normal soap.  6.  Wash thoroughly, paying special attention to the area where your surgery will be performed.  7.  Thoroughly rinse your body with warm water from the neck down.  8.  DO NOT shower/wash with your normal soap after using and rinsing off the CHG Soap.  9.  Pat yourself dry with a clean towel.            10 .  Wear clean pajamas.  11.  Place clean sheets on your bed the night of your first shower and do not sleep with pets.  Day of Surgery  Do not apply any lotions/deodorants the morning of surgery.  Please wear clean clothes to the hospital/surgery center..  Please read over the following fact sheets that you were given. Pain Booklet, Coughing and Deep Breathing and Surgical Site Infection Prevention

## 2016-01-07 NOTE — Progress Notes (Addendum)
Anesthesia Chart Review:  Pt is a 55 year old male scheduled for laparoscopic R inguinal hernia repair, possible L inguinal hernia repair, lysis of adhesions on 01/11/2016.   PMH includes:  Prostate cancer. Never smoker. BMI 34.   Pt reports episode of atrial fibrillation in 2011, none since, does not follow with cardiology. Notes in Epic (08/30/10) indicate pt experienced afib with RVR post-op from inguinal hernia repair. Treated with amiodarone and he converted after 48 hours. Saw Dr. Terrence Dupont as outpt x1 after discharge, metoprolol increased. Pt has never been back to cardiology, no longer takes metoprolol, and has not had any afib since.   Preoperative labs reviewed.    EKG 01/06/16: NSR with sinus arrhythmia  Echo 08/27/10:  - LV cavity size was normal. Wall thickness was increased in a pattern of mild LVH. Systolic function was normal. EF 50-55%. Wall motion normal; no regional wall motion abnormalities. LV diastolic function parameters were normal.  - LA without evidence of thrombus - Atrial septum without defect or patent foramen ovale.   If no changes, I anticipate pt can proceed with surgery as scheduled.   Willeen Cass, FNP-BC [facility]/Anesthesiology Phone: [phone] 01/08/2016 10:18 AM

## 2016-01-08 MED ORDER — DEXTROSE 5 % IV SOLN
3.0000 g | INTRAVENOUS | Status: AC
  Administered 2016-01-11: 3 g via INTRAVENOUS
  Filled 2016-01-08: qty 3000

## 2016-01-11 ENCOUNTER — Encounter (HOSPITAL_COMMUNITY): Payer: Self-pay | Admitting: *Deleted

## 2016-01-11 ENCOUNTER — Ambulatory Visit (HOSPITAL_COMMUNITY)
Admission: RE | Admit: 2016-01-11 | Discharge: 2016-01-11 | Disposition: A | Discharge status: Home / Self Care | Payer: BC Managed Care – PPO | Source: Ambulatory Visit | Attending: Surgery | Admitting: Surgery

## 2016-01-11 ENCOUNTER — Encounter (HOSPITAL_COMMUNITY)
Admission: RE | Disposition: A | Discharge status: Home / Self Care | Payer: Self-pay | Source: Ambulatory Visit | Attending: Surgery

## 2016-01-11 ENCOUNTER — Ambulatory Visit (HOSPITAL_COMMUNITY): Payer: BC Managed Care – PPO | Admitting: Emergency Medicine

## 2016-01-11 ENCOUNTER — Ambulatory Visit (HOSPITAL_COMMUNITY): Payer: BC Managed Care – PPO | Admitting: Certified Registered Nurse Anesthetist

## 2016-01-11 DIAGNOSIS — Z8546 Personal history of malignant neoplasm of prostate: Secondary | ICD-10-CM | POA: Diagnosis not present

## 2016-01-11 DIAGNOSIS — K4020 Bilateral inguinal hernia, without obstruction or gangrene, not specified as recurrent: Secondary | ICD-10-CM | POA: Diagnosis not present

## 2016-01-11 DIAGNOSIS — K4090 Unilateral inguinal hernia, without obstruction or gangrene, not specified as recurrent: Secondary | ICD-10-CM | POA: Diagnosis present

## 2016-01-11 HISTORY — PX: LYSIS OF ADHESION: SHX5961

## 2016-01-11 HISTORY — PX: INGUINAL HERNIA REPAIR: SHX194

## 2016-01-11 SURGERY — REPAIR, HERNIA, INGUINAL, LAPAROSCOPIC
Anesthesia: General | Site: Inguinal | Laterality: Bilateral

## 2016-01-11 MED ORDER — PROPOFOL 10 MG/ML IV BOLUS
INTRAVENOUS | Status: AC
  Filled 2016-01-11: qty 20

## 2016-01-11 MED ORDER — SODIUM CHLORIDE 0.9 % IR SOLN
Status: DC | PRN
  Administered 2016-01-11: 1000 mL

## 2016-01-11 MED ORDER — HYDROMORPHONE HCL 1 MG/ML IJ SOLN
0.2500 mg | INTRAMUSCULAR | Status: DC | PRN
  Administered 2016-01-11 (×2): 0.5 mg via INTRAVENOUS

## 2016-01-11 MED ORDER — SODIUM CHLORIDE 0.9 % IR SOLN
Status: DC | PRN
  Administered 2016-01-11 (×2): 1000 mL

## 2016-01-11 MED ORDER — LIDOCAINE HCL (CARDIAC) 20 MG/ML IV SOLN
INTRAVENOUS | Status: AC
  Filled 2016-01-11: qty 5

## 2016-01-11 MED ORDER — HYDROMORPHONE HCL 1 MG/ML IJ SOLN
INTRAMUSCULAR | Status: AC
  Filled 2016-01-11: qty 1

## 2016-01-11 MED ORDER — ROCURONIUM BROMIDE 100 MG/10ML IV SOLN
INTRAVENOUS | Status: DC | PRN
  Administered 2016-01-11: 10 mg via INTRAVENOUS
  Administered 2016-01-11: 50 mg via INTRAVENOUS
  Administered 2016-01-11 (×4): 10 mg via INTRAVENOUS

## 2016-01-11 MED ORDER — METHOCARBAMOL 750 MG PO TABS: 750.0000 mg | ORAL_TABLET | Freq: Four times a day (QID) | ORAL | Status: DC | PRN

## 2016-01-11 MED ORDER — LIDOCAINE HCL (CARDIAC) 20 MG/ML IV SOLN
INTRAVENOUS | Status: DC | PRN
  Administered 2016-01-11: 100 mg via INTRAVENOUS

## 2016-01-11 MED ORDER — SUGAMMADEX SODIUM 500 MG/5ML IV SOLN
INTRAVENOUS | Status: AC
  Filled 2016-01-11: qty 5

## 2016-01-11 MED ORDER — ONDANSETRON HCL 4 MG/2ML IJ SOLN
INTRAMUSCULAR | Status: DC | PRN
  Administered 2016-01-11: 10 mg via INTRAVENOUS

## 2016-01-11 MED ORDER — CHLORHEXIDINE GLUCONATE 4 % EX LIQD: 1.0000 "application " | Freq: Once | CUTANEOUS | Status: DC

## 2016-01-11 MED ORDER — FENTANYL CITRATE (PF) 250 MCG/5ML IJ SOLN
INTRAMUSCULAR | Status: AC
  Filled 2016-01-11: qty 5

## 2016-01-11 MED ORDER — OXYCODONE HCL 5 MG PO TABS: 5.0000 mg | ORAL_TABLET | ORAL | Status: DC | PRN

## 2016-01-11 MED ORDER — MIDAZOLAM HCL 2 MG/2ML IJ SOLN
INTRAMUSCULAR | Status: AC
  Filled 2016-01-11: qty 2

## 2016-01-11 MED ORDER — BUPIVACAINE-EPINEPHRINE 0.25% -1:200000 IJ SOLN
INTRAMUSCULAR | Status: DC | PRN
  Administered 2016-01-11: 30 mL
  Administered 2016-01-11: 90 mL

## 2016-01-11 MED ORDER — ROCURONIUM BROMIDE 50 MG/5ML IV SOLN
INTRAVENOUS | Status: AC
  Filled 2016-01-11: qty 1

## 2016-01-11 MED ORDER — SUGAMMADEX SODIUM 500 MG/5ML IV SOLN
INTRAVENOUS | Status: DC | PRN
  Administered 2016-01-11: 493.6 mg via INTRAVENOUS

## 2016-01-11 MED ORDER — NAPROXEN 500 MG PO TABS: 500.0000 mg | ORAL_TABLET | Freq: Two times a day (BID) | ORAL | Status: DC

## 2016-01-11 MED ORDER — DEXAMETHASONE SODIUM PHOSPHATE 4 MG/ML IJ SOLN
INTRAMUSCULAR | Status: AC
  Filled 2016-01-11: qty 2

## 2016-01-11 MED ORDER — LACTATED RINGERS IV SOLN
INTRAVENOUS | Status: DC
  Administered 2016-01-11: 12:00:00 via INTRAVENOUS

## 2016-01-11 MED ORDER — ONDANSETRON HCL 4 MG/2ML IJ SOLN
INTRAMUSCULAR | Status: AC
  Filled 2016-01-11: qty 2

## 2016-01-11 MED ORDER — BUPIVACAINE-EPINEPHRINE (PF) 0.25% -1:200000 IJ SOLN
INTRAMUSCULAR | Status: AC
  Filled 2016-01-11: qty 30

## 2016-01-11 MED ORDER — LACTATED RINGERS IV SOLN
INTRAVENOUS | Status: DC | PRN
  Administered 2016-01-11 (×2): via INTRAVENOUS

## 2016-01-11 MED ORDER — BUPIVACAINE-EPINEPHRINE (PF) 0.25% -1:200000 IJ SOLN
INTRAMUSCULAR | Status: AC
  Filled 2016-01-11: qty 60

## 2016-01-11 MED ORDER — FENTANYL CITRATE (PF) 100 MCG/2ML IJ SOLN
INTRAMUSCULAR | Status: DC | PRN
  Administered 2016-01-11 (×3): 50 ug via INTRAVENOUS
  Administered 2016-01-11: 100 ug via INTRAVENOUS

## 2016-01-11 MED ORDER — PROPOFOL 10 MG/ML IV BOLUS
INTRAVENOUS | Status: DC | PRN
  Administered 2016-01-11: 200 mg via INTRAVENOUS

## 2016-01-11 MED ORDER — MIDAZOLAM HCL 5 MG/5ML IJ SOLN
INTRAMUSCULAR | Status: DC | PRN
  Administered 2016-01-11: 2 mg via INTRAVENOUS

## 2016-01-11 SURGICAL SUPPLY — 51 items
APPLIER CLIP 5 13 M/L LIGAMAX5 (MISCELLANEOUS)
BLADE SURG CLIPPER 3M 9600 (MISCELLANEOUS) ×2 IMPLANT
CANISTER SUCTION 2500CC (MISCELLANEOUS) ×2 IMPLANT
CHLORAPREP W/TINT 26ML (MISCELLANEOUS) ×2 IMPLANT
CLIP APPLIE 5 13 M/L LIGAMAX5 (MISCELLANEOUS) IMPLANT
COVER SURGICAL LIGHT HANDLE (MISCELLANEOUS) ×2 IMPLANT
DRAPE LAPAROSCOPIC ABDOMINAL (DRAPES) ×2 IMPLANT
DRAPE WARM FLUID 44X44 (DRAPE) ×2 IMPLANT
DRSG TEGADERM 2-3/8X2-3/4 SM (GAUZE/BANDAGES/DRESSINGS) ×6 IMPLANT
DRSG TEGADERM 4X4.75 (GAUZE/BANDAGES/DRESSINGS) ×4 IMPLANT
ELECT REM PT RETURN 9FT ADLT (ELECTROSURGICAL) ×2
ELECTRODE REM PT RTRN 9FT ADLT (ELECTROSURGICAL) ×1 IMPLANT
GAUZE SPONGE 2X2 8PLY STRL LF (GAUZE/BANDAGES/DRESSINGS) ×2 IMPLANT
GLOVE BIOGEL PI IND STRL 6.5 (GLOVE) ×2 IMPLANT
GLOVE BIOGEL PI IND STRL 7.0 (GLOVE) ×1 IMPLANT
GLOVE BIOGEL PI IND STRL 8 (GLOVE) ×1 IMPLANT
GLOVE BIOGEL PI INDICATOR 6.5 (GLOVE) ×2
GLOVE BIOGEL PI INDICATOR 7.0 (GLOVE) ×1
GLOVE BIOGEL PI INDICATOR 8 (GLOVE) ×1
GLOVE ECLIPSE 8.0 STRL XLNG CF (GLOVE) ×2 IMPLANT
GLOVE SURG SS PI 6.5 STRL IVOR (GLOVE) ×2 IMPLANT
GLOVE SURG SS PI 7.0 STRL IVOR (GLOVE) ×2 IMPLANT
GOWN STRL REUS W/ TWL LRG LVL3 (GOWN DISPOSABLE) ×2 IMPLANT
GOWN STRL REUS W/ TWL XL LVL3 (GOWN DISPOSABLE) ×2 IMPLANT
GOWN STRL REUS W/TWL LRG LVL3 (GOWN DISPOSABLE) ×2
GOWN STRL REUS W/TWL XL LVL3 (GOWN DISPOSABLE) ×2
KIT BASIN OR (CUSTOM PROCEDURE TRAY) ×2 IMPLANT
KIT ROOM TURNOVER OR (KITS) ×2 IMPLANT
MESH ULTRAPRO 6X6 15CM15CM (Mesh General) ×6 IMPLANT
NEEDLE 22X1 1/2 (OR ONLY) (NEEDLE) ×2 IMPLANT
NS IRRIG 1000ML POUR BTL (IV SOLUTION) ×2 IMPLANT
PAD ARMBOARD 7.5X6 YLW CONV (MISCELLANEOUS) ×4 IMPLANT
SCISSORS LAP 5X35 DISP (ENDOMECHANICALS) ×2 IMPLANT
SET IRRIG TUBING LAPAROSCOPIC (IRRIGATION / IRRIGATOR) ×2 IMPLANT
SLEEVE ENDOPATH XCEL 5M (ENDOMECHANICALS) ×2 IMPLANT
SPONGE GAUZE 2X2 STER 10/PKG (GAUZE/BANDAGES/DRESSINGS) ×2
SUT ETHILON 3 0 PS 1 (SUTURE) ×2 IMPLANT
SUT MNCRL AB 4-0 PS2 18 (SUTURE) ×2 IMPLANT
SUT VIC AB 0 CT1 27 (SUTURE) ×1
SUT VIC AB 0 CT1 27XBRD ANBCTR (SUTURE) ×1 IMPLANT
SUT VIC AB 3-0 PS2 18 (SUTURE) ×1
SUT VIC AB 3-0 PS2 18XBRD (SUTURE) ×1 IMPLANT
SUT VICRYL 0 UR6 27IN ABS (SUTURE) ×2 IMPLANT
TACKER 5MM HERNIA 3.5CML NAB (ENDOMECHANICALS) IMPLANT
TOWEL OR 17X24 6PK STRL BLUE (TOWEL DISPOSABLE) ×2 IMPLANT
TOWEL OR 17X26 10 PK STRL BLUE (TOWEL DISPOSABLE) ×2 IMPLANT
TRAY FOLEY W/METER SILVER 14FR (SET/KITS/TRAYS/PACK) ×2 IMPLANT
TRAY LAPAROSCOPIC MC (CUSTOM PROCEDURE TRAY) ×2 IMPLANT
TROCAR XCEL BLUNT TIP 100MML (ENDOMECHANICALS) ×2 IMPLANT
TROCAR XCEL NON-BLD 5MMX100MML (ENDOMECHANICALS) ×2 IMPLANT
TUBING INSUFFLATION (TUBING) ×2 IMPLANT

## 2016-01-11 NOTE — H&P (Signed)
[place] 12/07/2015 3:48 PM Location: [facility] Patient #: [id number] DOB: 12/19/1960 Married / Language: English / Race: Black or African American Male   History of Present Illness   Patient words: RIH.  The patient is a 55 year old male who presents with an inguinal hernia. Note for "Inguinal hernia": Patient sent for surgical consultation by Eloise Levels, NP, [place] [facility] for concern of RIGHT inguinal hernia.  Pleasant active male. Status post robotic prostatectomy in 2008 by Dutch Gray. Also open RIGHT inguinal hernia repair with mesh and open umbilical hernia repair with mesh by Dr. Harlow Asa October 2011. Had new onset atrial fibrillation that required amiodarone infusion. Followed by Dr. Terrence Dupont. Apparently that is now resolved and he is no longer any anticoagulation aside from aspirin. Sounds like he may tend to declined aggressive medications or interventions.  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 No groin pop after doing some heavy lifting. It is been small and reducible until more recently. Its become larger and more painful. Point where it hurts to lift after an hour at work. He has a bowel movement every day. Normally can walk a half hour without difficulty the RIGHT groin. It has started to bother him much more. Because of that, he wished to reconsider surgical intervention. His wife agreed.  Has BIH R>L, ready for surgery.    Other Problems Marjean Donna, CMA; 12/07/2015 3:48 PM) Inguinal Hernia Prostate Cancer Umbilical Hernia Repair  Past Surgical History Marjean Donna, CMA; 12/07/2015 3:48 PM) Laparoscopic Inguinal Hernia Surgery Left. Prostate Surgery - Removal TURP  Diagnostic Studies History Marjean Donna, CMA; 12/07/2015 3:48 PM) Colonoscopy never  Allergies Davy Pique Bynum, CMA; 12/07/2015 3:49 PM) No Known Drug Allergies02/03/2016  Medication History Davy Pique  Bynum, CMA; 12/07/2015 3:49 PM) No Current Medications Medications Reconciled  Social History Marjean Donna, CMA; 12/07/2015 3:48 PM) Alcohol use Remotely quit alcohol use. Caffeine use Carbonated beverages, Coffee, Tea. No drug use Tobacco use Never smoker.  Family History Marjean Donna, [place]; 12/07/2015 3:48 PM) Alcohol Abuse Father. Breast Cancer Mother.    Review of Systems Davy Pique Bynum CMA; 12/07/2015 3:48 PM) General Not Present- Appetite Loss, Chills, Fatigue, Fever, Night Sweats, Weight Gain and Weight Loss. Skin Not Present- Change in Wart/Mole, Dryness, Hives, Jaundice, New Lesions, Non-Healing Wounds, Rash and Ulcer. HEENT Present- Seasonal Allergies. Not Present- Earache, Hearing Loss, Hoarseness, Nose Bleed, Oral Ulcers, Ringing in the Ears, Sinus Pain, Sore Throat, Visual Disturbances, Wears glasses/contact lenses and Yellow Eyes. Respiratory Not Present- Bloody sputum, Chronic Cough, Difficulty Breathing, Snoring and Wheezing. Breast Not Present- Breast Mass, Breast Pain, Nipple Discharge and Skin Changes. Cardiovascular Not Present- Chest Pain, Difficulty Breathing Lying Down, Leg Cramps, Palpitations, Rapid Heart Rate, Shortness of Breath and Swelling of Extremities. Gastrointestinal Not Present- Abdominal Pain, Bloating, Bloody Stool, Change in Bowel Habits, Chronic diarrhea, Constipation, Difficulty Swallowing, Excessive gas, Gets full quickly at meals, Hemorrhoids, Indigestion, Nausea, Rectal Pain and Vomiting. Male Genitourinary Not Present- Blood in Urine, Change in Urinary Stream, Frequency, Impotence, Nocturia, Painful Urination, Urgency and Urine Leakage. Musculoskeletal Not Present- Back Pain, Joint Pain, Joint Stiffness, Muscle Pain, Muscle Weakness and Swelling of Extremities. Neurological Not Present- Decreased Memory, Fainting, Headaches, Numbness, Seizures, Tingling, Tremor, Trouble walking and Weakness. Psychiatric Not Present- Anxiety, Bipolar, Change in Sleep  Pattern, Depression, Fearful and Frequent crying. Endocrine Not Present- Cold Intolerance, Excessive Hunger, Hair Changes, Heat Intolerance, Hot flashes and New Diabetes. Hematology Not Present- Easy  Bruising, Excessive bleeding, Gland problems, HIV and Persistent Infections.  Vitals (Sonya Bynum CMA; 12/07/2015 3:49 PM) 12/07/2015 3:49 PM Weight: 271 lb Height: 75in Body Surface Area: 2.5 m Body Mass Index: 33.87 kg/m  Pulse: 83 (Regular)  BP: 164/82 (Sitting, Left Arm, Standard)       Physical Exam Adin Hector MD; 12/07/2015 4:46 PM) General Mental Status-Alert. General Appearance-Not in acute distress, Not Sickly. Orientation-Oriented X3. Hydration-Well hydrated. Voice-Normal.  Integumentary Global Assessment Upon inspection and palpation of skin surfaces of the - Axillae: non-tender, no inflammation or ulceration, no drainage. and Distribution of scalp and body hair is normal. General Characteristics Temperature - normal warmth is noted.  Head and Neck Head-normocephalic, atraumatic with no lesions or palpable masses. Face Global Assessment - atraumatic, no absence of expression. Neck Global Assessment - no abnormal movements, no bruit auscultated on the right, no bruit auscultated on the left, no decreased range of motion, non-tender. Trachea-midline. Thyroid Gland Characteristics - non-tender.  Eye Eyeball - Left-Extraocular movements intact, No Nystagmus. Eyeball - Right-Extraocular movements intact, No Nystagmus. Cornea - Left-No Hazy. Cornea - Right-No Hazy. Sclera/Conjunctiva - Left-No scleral icterus, No Discharge. Sclera/Conjunctiva - Right-No scleral icterus, No Discharge. Pupil - Left-Direct reaction to light normal. Pupil - Right-Direct reaction to light normal.  ENMT Ears Pinna - Left - no drainage observed, no generalized tenderness observed. Right - no drainage observed, no generalized tenderness  observed. Nose and Sinuses External Inspection of the Nose - no destructive lesion observed. Inspection of the nares - Left - quiet respiration. Right - quiet respiration. Mouth and Throat Lips - Upper Lip - no fissures observed, no pallor noted. Lower Lip - no fissures observed, no pallor noted. Nasopharynx - no discharge present. Oral Cavity/Oropharynx - Tongue - no dryness observed. Oral Mucosa - no cyanosis observed. Hypopharynx - no evidence of airway distress observed.  Chest and Lung Exam Inspection Movements - Normal and Symmetrical. Accessory muscles - No use of accessory muscles in breathing. Palpation Palpation of the chest reveals - Non-tender. Auscultation Breath sounds - Normal and Clear.  Cardiovascular Auscultation Rhythm - Regular. Murmurs & Other Heart Sounds - Auscultation of the heart reveals - No Murmurs and No Systolic Clicks.  Abdomen Inspection Inspection of the abdomen reveals - No Visible peristalsis and No Abnormal pulsations. Umbilicus - No Bleeding, No Urine drainage. Palpation/Percussion Palpation and Percussion of the abdomen reveal - Soft, Non Tender, No Rebound tenderness, No Rigidity (guarding) and No Cutaneous hyperesthesia. Note: Overweight but soft. No diastases. Infraumbilical transverse incision consistent with open hernia repair with mesh.   Male Genitourinary Sexual Maturity Tanner 5 - Adult hair pattern and Adult penile size and shape. Note: Definite RIGHT groin bulge to upper scrotum consistent with moderate RIGHT inguinal hernia. Reducible. Bowel sounds. Impulse at LEFT groin suspicious for possible recurrent LEFT inguinal hernia as well versus old spermatic cord lipoma.  Testes, epididymides, spermatic cords were within normal limits. Circumcised.   Peripheral Vascular Upper Extremity Inspection - Left - No Cyanotic nailbeds, Not Ischemic. Right - No Cyanotic nailbeds, Not Ischemic.  Neurologic Neurologic evaluation reveals  -normal attention span and ability to concentrate, able to name objects and repeat phrases. Appropriate fund of knowledge , normal sensation and normal coordination. Mental Status Affect - not angry, not paranoid. Cranial Nerves-Normal Bilaterally. Gait-Normal.  Neuropsychiatric Mental status exam performed with findings of-able to articulate well with normal speech/language, rate, volume and coherence, thought content normal with ability to perform basic computations and apply abstract reasoning and  no evidence of hallucinations, delusions, obsessions or homicidal/suicidal ideation.  Musculoskeletal Global Assessment Spine, Ribs and Pelvis - no instability, subluxation or laxity. Right Upper Extremity - no instability, subluxation or laxity.  Lymphatic Head & Neck  General Head & Neck Lymphatics: Bilateral - Description - No Localized lymphadenopathy. Axillary  General Axillary Region: Bilateral - Description - No Localized lymphadenopathy. Femoral & Inguinal  Generalized Femoral & Inguinal Lymphatics: Left - Description - No Localized lymphadenopathy. Right - Description - No Localized lymphadenopathy.    Assessment & Plan   RIGHT INGUINAL HERNIA (K40.90), PROBABLE BILATERAL  Impression: Definite RIGHT inguinal hernia going down to Scrotum. Some bulging on the LEFT side suspicious for recurrent LEFT inguinal hernia as well.  I think he would benefit from surgical repair. Ideally would do laparoscopic underlay repair with extra sheets of mesh to minimize recurrence in this very active large man. Challenge will be that he has had prior mesh repairs in is that prior robotic prostatectomy, so may need to convert to open procedure with mesh. Most likely will have to be a TAPP type repair since he has mesh at his umbilicus. We will see.  It is hurting on a daily basis with his heavy lifting requirements. It is reasonable to downshift him to light duty only for now. 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 He skeptical there is any other options so he most likely have to be off work until he can get repaired . Given his heavy difficulty requirements and sensitivity, I expect 6 weeks postop recovery before returning to unrestricted activity. Trying get this scheduled in a reasonable time since it is increasingly symptomatic now on a daily basis and affects his ability to do his job.   ENCOUNTER FOR PRE-OP - GROIN HERNIA (Z01.818) Current Plans You are being scheduled for surgery - Our schedulers will call you.  You should hear from our office's scheduling department within 5 working days about the location, date, and time of surgery. We try to make accommodations for patient's preferences in scheduling surgery, but sometimes the OR schedule or the surgeon's schedule prevents [place] from making those accommodations.  If you have not heard from our office [phone]) in 5 working days, call the office and ask for your surgeon's nurse.  If you have other questions about your diagnosis, plan, or surgery, call the office and ask for your surgeon's nurse.  Pt Education - Pamphlet Given - Laparoscopic Hernia Repair: discussed with patient and provided information. The anatomy & physiology of the abdominal wall and pelvic floor was discussed. The pathophysiology of hernias in the inguinal and pelvic region was discussed. Natural history risks such as progressive enlargement, pain, incarceration, and strangulation was discussed. Contributors to complications such as smoking, obesity, diabetes, prior surgery, etc were discussed.  I feel the risks of no intervention will lead to serious problems that outweigh the operative risks; therefore, I recommended surgery to reduce and repair the hernia. I explained laparoscopic techniques with possible need for an open approach. I noted usual use of mesh to patch and/or buttress hernia repair  Risks such as  bleeding, infection, abscess, need for further treatment, heart attack, death, and other risks were discussed. I noted a good likelihood this will help address the problem. Goals of post-operative recovery were discussed as well. Possibility that this will not correct all symptoms was explained. I stressed the importance of low-impact activity, aggressive pain control, avoiding constipation, & not pushing through pain to  minimize risk of post-operative chronic pain or injury. Possibility of reherniation was discussed. We will work to minimize complications.  An educational handout further explaining the pathology & treatment options was given as well. Questions were answered. The patient expresses understanding & wishes to proceed with surgery.  Pt Education - [facility] Hernia Post-Op HCI (Jaquon Gingerich): discussed with patient and provided information. Pt Education - [facility] Pain Control (Karon Cotterill)  Adin Hector, M.D., F.A.C.S. Gastrointestinal and Minimally Invasive Surgery [facility] [address], [place], [place] [address] [phone] Main / Paging

## 2016-01-11 NOTE — Interval H&P Note (Signed)
History and Physical Interval Note:  01/11/2016 1:34 PM  [name]  has presented today for surgery, with the diagnosis of Right inguinal hernia possible recurrent LIH   The various methods of treatment have been discussed with the patient and family. After consideration of risks, benefits and other options for treatment, the patient has consented to  Procedure(s): LAPAROSCOPIC RIGHT INGUINAL HERNIA POSSIBLE LEFT INGUINAL HERNIA  (Right) LYSIS OF ADHESION (N/A) as a surgical intervention .  [name] has been reviewed, patient examined, no change in status, stable for surgery.  I have reviewed the patient's chart and labs.  Questions were answered to the patient's satisfaction.     Daschel Roughton C.

## 2016-01-11 NOTE — Op Note (Addendum)
01/11/2016  4:47 PM  PATIENT:  [name]  55 y.o. male  Patient Care Team: Eloise Levels, NP as PCP - General (Nurse Practitioner) Michael Boston, MD as Consulting Physician (General Surgery) Raynelle Bring, MD as Consulting Physician (Urology) Charolette Forward, MD as Consulting Physician (Cardiology)  PRE-OPERATIVE DIAGNOSIS:  Right inguinal hernia possible recurrent LIH   POST-OPERATIVE DIAGNOSIS:  Bilateral inguinal hernias (Left recurrent)  PROCEDURE:  Procedure(s): LAPAROSCOPIC BILATERAL INGUINAL HERNIA REPAIR WITH MESH LYSIS OF ADHESIONS X 90MIN  SURGEON:  Surgeon(s): Michael Boston, MD  ASSISTANT: RN   ANESTHESIA:   Regional ilioinguinal and genitofemoral and spermatic cord nerve blocks with GETA  EBL:  Total I/O In: 1000 [I.V.:1000] Out: 36 [Urine:35; Blood:30]  Delay start of Pharmacological VTE agent (>24hrs) due to surgical blood loss or risk of bleeding:  no  DRAINS: NONE  SPECIMEN:  NONE  DISPOSITION OF SPECIMEN:  N/A  COUNTS:  YES  PLAN OF CARE: Discharge to home after PACU  PATIENT DISPOSITION:  PACU - hemodynamically stable.  INDICATION: Patient s/p open LIH repair with large RIH.  H/o robotic prostatectomy.  I recommended laparaoscopic, possible open, repair of hernias  The anatomy & physiology of the abdominal wall and pelvic floor was discussed.  The pathophysiology of hernias in the inguinal and pelvic region was discussed.  Natural history risks such as progressive enlargement, pain, incarceration & strangulation was discussed.   Contributors to complications such as smoking, obesity, diabetes, prior surgery, etc were discussed.    I feel the risks of no intervention will lead to serious problems that outweigh the operative risks; therefore, I recommended surgery to reduce and repair the hernia.  I explained laparoscopic techniques with possible need for an open approach.  I noted usual use of mesh to patch and/or buttress hernia repair  Risks such  as bleeding, infection, abscess, need for further treatment, heart attack, death, and other risks were discussed.  I noted a good likelihood this will help address the problem.   Goals of post-operative recovery were discussed as well.  Possibility that this will not correct all symptoms was explained.  I stressed the importance of low-impact activity, aggressive pain control, avoiding constipation, & not pushing through pain to minimize risk of post-operative chronic pain or injury. Possibility of reherniation was discussed.  We will work to minimize complications.     An educational handout further explaining the pathology & treatment options was given as well.  Questions were answered.  The patient expresses understanding & wishes to proceed with surgery.  OR FINDINGS: Large RIGHT indirect inguinal hernia.  Small lateral left recurrent inguinal hernia  DESCRIPTION:   The patient was identified & brought into the operating room. The patient was positioned supine with arms tucked. SCDs were active during the entire case. The patient underwent general anesthesia without any difficulty.  The abdomen was prepped and draped in a sterile fashion. The patient's bladder was emptied.  A Surgical Timeout confirmed our plan.  I made a transverse incision through the inferior umbilical fold.  Initially could not find the preperitoneal plane well.  I did optical entry with a 5 [place] in the left lateral aspect and elected to a safer cutdown to get into the preperitoneal plane. made a small transverse nick through the anterior rectus fascia contralateral to the inguinal hernia side and placed a 0-vicryl stitch through the fascia.  I placed a Hasson trocar into the preperitoneal plane.  Entry was clean.  We induced carbon dioxide insufflation. Camera  inspection revealed no injury.  I used a 55mm angled scope to bluntly free the peritoneum off the infraumbilical anterior abdominal wall.  I created enough of a  preperitoneal pocket to place 49mm ports into the right & left mid-abdomen into this preperitoneal cavity.  I focused attention on the right side since that was the dominant hernia side.   I used blunt & focused sharp dissection to free the peritoneum off the flank and down to the pubic rim.  I freed the anteriolateral bladder wall off the anteriolateral pelvic wall, sparing midline attachments.   I located a swath of peritoneum going into a hernia fascial defect at the internal ring consistent with an indirect inguinal hernia.   patient had rather dense preperitoneal adhesions from his prior robotic prostatectomy adherent to the pelvic brim.  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  These were gradually reduce and skeletonized and removed.  Patient had a giant hernia sac since this was going down to the scrotum.  I gradually freed the peritoneal hernia sac off safely and reduced it into the preperitoneal space.  I freed the peritoneum off the spermatic vessels & vas deferens.  I freed peritoneum off the retroperitoneum along the psoas muscle.    I checked & assured hemostasis.  I did transected some redundant hernia and sac removed.  I did a high ligation with 3-0 Vicryl intracorporeal suturing to close that down.  The giant sake with very dense adhesions.  Rigidity of his abdominal wall.  Therefore lyse lesions was longer than average.  I turned attention on the opposite side.  I did dissection in a similar, mirror-image fashion. The patient had a smaller left inguinal hernia, lateral recutrrence.  Hernia sac was not particularly large child off on doing any high ligation.  No peritoneal repair as needed.  Smaller spermatic cord lipomas isolated and removed.      I chose 15x15 cm sheets of ultra-lightweight polypropylene mesh (Ultrapro), one for each side.  I cut a single sigmoid-shaped slit ~6cm from a corner of each mesh.  I placed the meshes into the preperitoneal space & laid them as  overlapping diamonds such that at the inferior points, a 6x6 cm corner flap rested in the true anterolateral pelvis, covering the obturator & femoral foramina.   I allowed the bladder to return to the pubis, this helping tuck the corners of the mesh in the anteriolateral pelvis.  The medial corners overlapped each other across midline cephalad to the pubic rim.   This provided >2 inch coverage around the hernia.  Because of recurrence of large size, I did not place a third mesh as a diamond along the midline over the dome of the bladder.  The lateral wings overlapped the direct space and internal rings for good overlap. Because the defects were well covered, I did not place any tacks.  I held the hernia sacs cephalad & evacuated carbon dioxide.  I closed the fascia with absorbable suture.  I closed the skin using 4-0 monocryl stitch.  Sterile dressings were applied. The patient was extubated & arrived in the PACU in stable condition..  I had discussed postoperative care with the patient in the holding area.   I did discuss operative findings and postoperative goals / instructions to the patient's wife as well.  Instructions are written in the chart.  Adin Hector, M.D., F.A.C.S. Gastrointestinal and Minimally Invasive Surgery [facility] [address], [address]  [place], [place] [address] [phone] Main / Paging

## 2016-01-11 NOTE — Anesthesia Postprocedure Evaluation (Signed)
Anesthesia Post Note  Patient: Energy manager  Procedure(s) Performed: Procedure(s) (LRB): LAPAROSCOPIC BILATERAL INGUINAL HERNIA REPAIR WITH MESH (Bilateral) LYSIS OF ADHESION (Bilateral)  Patient location during evaluation: PACU Anesthesia Type: General Level of consciousness: awake Pain management: pain level controlled Respiratory status: spontaneous breathing Cardiovascular status: stable Anesthetic complications: no    Last Vitals:  Filed Vitals:   01/11/16 1814 01/11/16 1815  BP: 139/96   Pulse: 64 66  Temp:    Resp: 13 15    Last Pain:  Filed Vitals:   01/11/16 1826  PainSc: 9                  EDWARDS,Ishitha Roper

## 2016-01-11 NOTE — Anesthesia Preprocedure Evaluation (Addendum)
Anesthesia Evaluation  Patient identified by MRN, date of birth, ID band Patient awake  General Assessment Comment:History noted. CE  Reviewed: Allergy & Precautions, NPO status , Patient's Chart, lab work & pertinent test results  Airway Mallampati: II  TM Distance: >3 FB Neck ROM: Full    Dental   Pulmonary neg pulmonary ROS,    breath sounds clear to auscultation       Cardiovascular negative cardio ROS   Rhythm:Regular Rate:Normal     Neuro/Psych    GI/Hepatic negative GI ROS, Neg liver ROS,   Endo/Other  negative endocrine ROS  Renal/GU negative Renal ROS     Musculoskeletal   Abdominal   Peds  Hematology   Anesthesia Other Findings   Reproductive/Obstetrics                             Anesthesia Physical Anesthesia Plan  ASA: II  Anesthesia Plan: General   Post-op Pain Management:    Induction: Intravenous  Airway Management Planned: Oral ETT  Additional Equipment:   Intra-op Plan:   Post-operative Plan: Extubation in OR  Informed Consent: I have reviewed the patients History and Physical, chart, labs and discussed the procedure including the risks, benefits and alternatives for the proposed anesthesia with the patient or authorized representative who has indicated his/her understanding and acceptance.   Dental advisory given  Plan Discussed with: CRNA, Anesthesiologist and Surgeon  Anesthesia Plan Comments:         Anesthesia Quick Evaluation

## 2016-01-11 NOTE — Transfer of Care (Signed)
Immediate Anesthesia Transfer of Care Note  Patient: [name]  Procedure(s) Performed: Procedure(s): LAPAROSCOPIC BILATERAL INGUINAL HERNIA REPAIR WITH MESH (Bilateral) LYSIS OF ADHESION (Bilateral)  Patient Location: PACU  Anesthesia Type:General  Level of Consciousness: awake and alert   Airway & Oxygen Therapy: Patient Spontanous Breathing and Patient connected to nasal cannula oxygen  Post-op Assessment: Report given to RN and Post -op Vital signs reviewed and stable  Post vital signs: Reviewed and stable  Last Vitals:  Filed Vitals:   01/11/16 1144 01/11/16 1159  BP: 155/109 135/88  Pulse: 114   Temp: 36.8 C   Resp: 18     Complications: No apparent anesthesia complications

## 2016-01-11 NOTE — Interval H&P Note (Signed)
History and Physical Interval Note:  01/11/2016 1:34 PM  [name]  has presented today for surgery, with the diagnosis of Right inguinal hernia possible recurrent LIH   The various methods of treatment have been discussed with the patient and family. After consideration of risks, benefits and other options for treatment, the patient has consented to  Procedure(s): LAPAROSCOPIC RIGHT INGUINAL HERNIA POSSIBLE LEFT INGUINAL HERNIA  (Right) LYSIS OF ADHESION (N/A) as a surgical intervention .  [name] has been reviewed, patient examined, no change in status, stable for surgery.  I have reviewed the patient's chart and labs.  Questions were answered to the patient's satisfaction.     Clifford Benninger C.

## 2016-01-11 NOTE — Anesthesia Procedure Notes (Signed)
Procedure Name: Intubation Date/Time: 01/11/2016 1:57 PM Performed by: Tressia Miners LEFFEW Pre-anesthesia Checklist: Patient identified, Patient being monitored, Timeout performed, Emergency Drugs available and Suction available Patient Re-evaluated:Patient Re-evaluated prior to inductionOxygen Delivery Method: Circle System Utilized Preoxygenation: Pre-oxygenation with 100% oxygen Intubation Type: IV induction Ventilation: Mask ventilation without difficulty Laryngoscope Size: Mac and 4 Grade View: Grade I Tube type: Oral Tube size: 7.5 mm Number of attempts: 1 Airway Equipment and Method: Stylet Placement Confirmation: ETT inserted through vocal cords under direct vision,  positive ETCO2 and breath sounds checked- equal and bilateral Secured at: 23 cm Tube secured with: Tape Dental Injury: Teeth and Oropharynx as per pre-operative assessment

## 2016-01-11 NOTE — Discharge Instructions (Signed)
HERNIA REPAIR: POST OP INSTRUCTIONS ° °1. DIET: Follow a light bland diet the first 24 hours after arrival home, such as soup, liquids, crackers, etc.  Be sure to include lots of fluids daily.  Avoid fast food or heavy meals as your are more likely to get nauseated.  Eat a low fat the next few days after surgery. °2. Take your usually prescribed home medications unless otherwise directed. °3. PAIN CONTROL: °a. Pain is best controlled by a usual combination of three different methods TOGETHER: °i. Ice/Heat °ii. Over the counter pain medication °iii. Prescription pain medication °b. Most patients will experience some swelling and bruising around the hernia(s) such as the bellybutton, groins, or old incisions.  Ice packs or heating pads (30-60 minutes up to 6 times a day) will help. Use ice for the first few days to help decrease swelling and bruising, then switch to heat to help relax tight/sore spots and speed recovery.  Some people prefer to use ice alone, heat alone, alternating between ice & heat.  Experiment to what works for you.  Swelling and bruising can take several weeks to resolve.   °c. It is helpful to take an over-the-counter pain medication regularly for the first few weeks.  Choose one of the following that works best for you: °i. Naproxen (Aleve, etc)  Two 220mg tabs twice a day °ii. Ibuprofen (Advil, etc) Three 200mg tabs four times a day (every meal & bedtime) °iii. Acetaminophen (Tylenol, etc) 325-650mg four times a day (every meal & bedtime) °d. A  prescription for pain medication should be given to you upon discharge.  Take your pain medication as prescribed.  °i. If you are having problems/concerns with the prescription medicine (does not control pain, nausea, vomiting, rash, itching, etc), please call us [phone] to see if we need to switch you to a different pain medicine that will work better for you and/or control your side effect better. °ii. If you need a refill on your pain  medication, please contact your pharmacy.  They will contact our office to request authorization. Prescriptions will not be filled after 5 pm or on week-ends. °4. Avoid getting constipated.  Between the surgery and the pain medications, it is common to experience some constipation.  Increasing fluid intake and taking a fiber supplement (such as Metamucil, Citrucel, FiberCon, MiraLax, etc) 1-2 times a day regularly will usually help prevent this problem from occurring.  A mild laxative (prune juice, Milk of Magnesia, MiraLax, etc) should be taken according to package directions if there are no bowel movements after 48 hours.   °5. Wash / shower every day.  You may shower over the dressings as they are waterproof.   °6. Remove your waterproof bandages 5 days after surgery.  You may leave the incision open to air.  You may replace a dressing/Band-Aid to cover the incision for comfort if you wish.  Continue to shower over incision(s) after the dressing is off. ° ° ° °7. ACTIVITIES as tolerated:   °a. You may resume regular (light) daily activities beginning the next day--such as daily self-care, walking, climbing stairs--gradually increasing activities as tolerated.  If you can walk 30 minutes without difficulty, it is safe to try more intense activity such as jogging, treadmill, bicycling, low-impact aerobics, swimming, etc. °b. Save the most intensive and strenuous activity for last such as sit-ups, heavy lifting, contact sports, etc  Refrain from any heavy lifting or straining until you are off narcotics for pain control.   °  c. DO NOT PUSH THROUGH PAIN.  Let pain be your guide: If it hurts to do something, don't do it.  Pain is your body warning you to avoid that activity for another week until the pain goes down. °d. You may drive when you are no longer taking prescription pain medication, you can comfortably wear a seatbelt, and you can safely maneuver your car and apply brakes. °e. You may have sexual intercourse  when it is comfortable.  °8. FOLLOW UP in our office °a. Please call [facility] at [phone] to set up an appointment to see your surgeon in the office for a follow-up appointment approximately 2-3 weeks after your surgery. °b. Make sure that you call for this appointment the day you arrive home to insure a convenient appointment time. °9.  IF YOU HAVE DISABILITY OR FAMILY LEAVE FORMS, BRING THEM TO THE OFFICE FOR PROCESSING.  DO NOT GIVE THEM TO YOUR DOCTOR. ° °WHEN TO CALL US [phone]: °1. Poor pain control °2. Reactions / problems with new medications (rash/itching, nausea, etc)  °3. Fever over 101.5 F (38.5 C) °4. Inability to urinate °5. Nausea and/or vomiting °6. Worsening swelling or bruising °7. Continued bleeding from incision. °8. Increased pain, redness, or drainage from the incision ° ° The clinic staff is available to answer your questions during regular business hours (8:30am-5pm).  Please don’t hesitate to call and ask to speak to one of our nurses for clinical concerns.  ° If you have a medical emergency, go to the nearest emergency room or call 911. ° A surgeon from [facility] is always on call at the hospitals in [place] ° °[facility] °[address], [address], [place], [place]  [address] ? ° [address] [address], [place], [place]   [address] °MAIN: [phone] ? TOLL FREE: [phone] ? FAX: [phone] °[email or web address] ° °Managing Pain ° °Pain after surgery or related to activity is often due to strain/injury to muscle, tendon, nerves and/or incisions.  This pain is usually short-term and will improve in a few months.  ° °Many people find it helpful to do the following things TOGETHER to help speed the process of healing and to get back to regular activity more quickly: ° °1. Avoid heavy physical activity at first °a. No lifting greater than 20 pounds at first, then increase to lifting as tolerated over the next few weeks °b. Do not “push  through” the pain.  Listen to your body and avoid positions and maneuvers than reproduce the pain.  Wait a few days before trying something more intense °c. Walking is okay as tolerated, but go slowly and stop when getting sore.  If you can walk 30 minutes without stopping or pain, you can try more intense activity (running, jogging, aerobics, cycling, swimming, treadmill, sex, sports, weightlifting, etc ) °d. Remember: If it hurts to do it, then don’t do it! ° °2. Take Anti-inflammatory medication °a. Choose ONE of the following over-the-counter medications: °i.            Acetaminophen 500mg tabs (Tylenol) 1-2 pills with every meal and just before bedtime (avoid if you have liver problems) °ii.            Naproxen 220mg tabs (ex. Aleve) 1-2 pills twice a day (avoid if you have kidney, stomach, IBD, or bleeding problems) °iii. Ibuprofen 200mg tabs (ex. Advil, Motrin) 3-4 pills with every meal and just before bedtime (avoid if you have kidney, stomach, IBD, or bleeding   problems) °b. Take with food/snack around the clock for 1-2 weeks °i. This helps the muscle and nerve tissues become less irritable and calm down faster ° °3. Use a Heating pad or Ice/Cold Pack °a. 4-6 times a day °b. May use warm bath/hottub  or showers ° °4. Try Gentle Massage and/or Stretching  °a. at the area of pain many times a day °b. stop if you feel pain - do not overdo it ° °Try these steps together to help you body heal faster and avoid making things get worse.  Doing just one of these things may not be enough.   ° °If you are not getting better after two weeks or are noticing you are getting worse, contact our office for further advice; we may need to re-evaluate you & see what other things we can do to help. ° °GETTING TO GOOD BOWEL HEALTH. °Irregular bowel habits such as constipation and diarrhea can lead to many problems over time.  Having one soft bowel movement a day is the most important way to prevent further problems.  The  anorectal canal is designed to handle stretching and feces to safely manage our ability to get rid of solid waste (feces, poop, stool) out of our body.  BUT, hard constipated stools can act like ripping concrete bricks and diarrhea can be a burning fire to this very sensitive area of our body, causing inflamed hemorrhoids, anal fissures, increasing risk is perirectal abscesses, abdominal pain/bloating, an making irritable bowel worse.     ° °The goal: ONE SOFT BOWEL MOVEMENT A DAY!  To have soft, regular bowel movements:  °• Drink plenty of fluids, consider 4-6 tall glasses of water a day.   °• Take plenty of fiber.  Fiber is the undigested part of plant food that passes into the colon, acting s “natures broom” to encourage bowel motility and movement.  Fiber can absorb and hold large amounts of water. This results in a larger, bulkier stool, which is soft and easier to pass. Work gradually over several weeks up to 6 servings a day of fiber (25g a day even more if needed) in the form of: °o Vegetables -- Root (potatoes, carrots, turnips), leafy green (lettuce, salad greens, celery, spinach), or cooked high residue (cabbage, broccoli, etc) °o Fruit -- Fresh (unpeeled skin & pulp), Dried (prunes, apricots, cherries, etc ),  or stewed ( applesauce)  °o Whole grain breads, pasta, etc (whole wheat)  °o Bran cereals  °• Bulking Agents -- This type of water-retaining fiber generally is easily obtained each day by one of the following:  °o Psyllium bran -- The psyllium plant is remarkable because its ground seeds can retain so much water. This product is available as Metamucil, Konsyl, Effersyllium, Per Diem Fiber, or the less expensive generic preparation in drug and health food stores. Although labeled a laxative, it really is not a laxative.  °o Methylcellulose -- This is another fiber derived from wood which also retains water. It is available as Citrucel. °o Polyethylene Glycol - and “artificial” fiber commonly called  Miralax or Glycolax.  It is helpful for people with gassy or bloated feelings with regular fiber °o Flax Seed - a less gassy fiber than psyllium °• No reading or other relaxing activity while on the toilet. If bowel movements take longer than 5 minutes, you are too constipated °• AVOID CONSTIPATION.  High fiber and water intake usually takes care of this.  Sometimes a laxative is needed to stimulate more frequent   bowel movements, but   Laxatives are not a good long-term solution as it can wear the colon out.  They can help jump-start bowels if constipated, but should be relied on constantly without discussing with your doctor o Osmotics (Milk of Magnesia, Fleets phosphosoda, Magnesium citrate, MiraLax, GoLytely) are safer than  o Stimulants (Senokot, Castor Oil, Dulcolax, Ex Lax)    o Avoid taking laxatives for more than 7 days in a row.   IF SEVERELY CONSTIPATED, try a Bowel Retraining Program: o Do not use laxatives.  o Eat a diet high in roughage, such as bran cereals and leafy vegetables.  o Drink six (6) ounces of prune or apricot juice each morning.  o Eat two (2) large servings of stewed fruit each day.  o Take one (1) heaping tablespoon of a psyllium-based bulking agent twice a day. Use sugar-free sweetener when possible to avoid excessive calories.  o Eat a normal breakfast.  o Set aside 15 minutes after breakfast to sit on the toilet, but do not strain to have a bowel movement.  o If you do not have a bowel movement by the third day, use an enema and repeat the above steps.   Controlling diarrhea o Switch to liquids and simpler foods for a few days to avoid stressing your intestines further. o Avoid dairy products (especially milk & ice cream) for a short time.  The intestines often can lose the ability to digest lactose when stressed. o Avoid foods that cause gassiness or bloating.  Typical foods include beans and other legumes, cabbage, broccoli, and dairy foods.  Every person has  some sensitivity to other foods, so listen to our body and avoid those foods that trigger problems for you. o Adding fiber (Citrucel, Metamucil, psyllium, Miralax) gradually can help thicken stools by absorbing excess fluid and retrain the intestines to act more normally.  Slowly increase the dose over a few weeks.  Too much fiber too soon can backfire and cause cramping & bloating. o Probiotics (such as active yogurt, Align, etc) may help repopulate the intestines and colon with normal bacteria and calm down a sensitive digestive tract.  Most studies show it to be of mild help, though, and such products can be costly. o Medicines: - Bismuth subsalicylate (ex. Kayopectate, Pepto Bismol) every 30 minutes for up to 6 doses can help control diarrhea.  Avoid if pregnant. - Loperamide (Immodium) can slow down diarrhea.  Start with two tablets (4mg  total) first and then try one tablet every 6 hours.  Avoid if you are having fevers or severe pain.  If you are not better or start feeling worse, stop all medicines and call your doctor for advice o Call your doctor if you are getting worse or not better.  Sometimes further testing (cultures, endoscopy, X-ray studies, bloodwork, etc) may be needed to help diagnose and treat the cause of the diarrhea.  TROUBLESHOOTING IRREGULAR BOWELS 1) Avoid extremes of bowel movements (no bad constipation/diarrhea) 2) Miralax 17gm mixed in 8oz. water or juice-daily. May use BID as needed.  3) Gas-x,Phazyme, etc. as needed for gas & bloating.  4) Soft,bland diet. No spicy,greasy,fried foods.  5) Prilosec over-the-counter as needed  6) May hold gluten/wheat products from diet to see if symptoms improve.  7)  May try probiotics (Align, Activa, etc) to help calm the bowels down 7) If symptoms become worse call back immediately.  Inguinal Hernia, Adult Muscles help keep everything in the body in its proper place. But if  a weak spot in the muscles develops, something can poke  through. That is called a hernia. When this happens in the lower part of the belly (abdomen), it is called an inguinal hernia. (It takes its name from a part of the body in this region called the inguinal canal.) A weak spot in the wall of muscles lets some fat or part of the small intestine bulge through. An inguinal hernia can develop at any age. Men get them more often than women. °CAUSES  °In adults, an inguinal hernia develops over time. °· It can be triggered by: °¨ Suddenly straining the muscles of the lower abdomen. °¨ Lifting heavy objects. °¨ Straining to have a bowel movement. Difficult bowel movements (constipation) can lead to this. °¨ Constant coughing. This may be caused by smoking or lung disease. °¨ Being overweight. °¨ Being pregnant. °¨ Working at a job that requires long periods of standing or heavy lifting. °¨ Having had an inguinal hernia before. °One type can be an emergency situation. It is called a strangulated inguinal hernia. It develops if part of the small intestine slips through the weak spot and cannot get back into the abdomen. The blood supply can be cut off. If that happens, part of the intestine may die. This situation requires emergency surgery. °SYMPTOMS  °Often, a small inguinal hernia has no symptoms. It is found when a healthcare provider does a physical exam. Larger hernias usually have symptoms.  °· In adults, symptoms may include: °¨ A lump in the groin. This is easier to see when the person is standing. It might disappear when lying down. °¨ In men, a lump in the scrotum. °¨ Pain or burning in the groin. This occurs especially when lifting, straining or coughing. °¨ A dull ache or feeling of pressure in the groin. °· Signs of a strangulated hernia can include: °¨ A bulge in the groin that becomes very painful and tender to the touch. °¨ A bulge that turns red or purple. °¨ Fever, nausea and vomiting. °¨ Inability to have a bowel movement or to pass gas. °DIAGNOSIS  °To  decide if you have an inguinal hernia, a healthcare provider will probably do a physical examination. °· This will include asking questions about any symptoms you have noticed. °· The healthcare provider might feel the groin area and ask you to cough. If an inguinal hernia is felt, the healthcare provider may try to slide it back into the abdomen. °· Usually no other tests are needed. °TREATMENT  °Treatments can vary. The size of the hernia makes a difference. Options include: °· Watchful waiting. This is often suggested if the hernia is small and you have had no symptoms. °¨ No medical procedure will be done unless symptoms develop. °¨ You will need to watch closely for symptoms. If any occur, contact your healthcare provider right away. °· Surgery. This is used if the hernia is larger or you have symptoms. °¨ Open surgery. This is usually an outpatient procedure (you will not stay overnight in a hospital). An cut (incision) is made through the skin in the groin. The hernia is put back inside the abdomen. The weak area in the muscles is then repaired by herniorrhaphy or hernioplasty. Herniorrhaphy: in this type of surgery, the weak muscles are sewn back together. Hernioplasty: a patch or mesh is used to close the weak area in the abdominal wall. °¨ Laparoscopy. In this procedure, a surgeon makes small incisions. A thin tube with a tiny video   camera (called a laparoscope) is put into the abdomen. The surgeon repairs the hernia with mesh by looking with the video camera and using two long instruments. °HOME CARE INSTRUCTIONS  °· After surgery to repair an inguinal hernia: °¨ You will need to take pain medicine prescribed by your healthcare provider. Follow all directions carefully. °¨ You will need to take care of the wound from the incision. °¨ Your activity will be restricted for awhile. This will probably include no heavy lifting for several weeks. You also should not do anything too active for a few weeks. When  you can return to work will depend on the type of job that you have. °· During "watchful waiting" periods, you should: °¨ Maintain a healthy weight. °¨ Eat a diet high in fiber (fruits, vegetables and whole grains). °¨ Drink plenty of fluids to avoid constipation. This means drinking enough water and other liquids to keep your urine clear or pale yellow. °¨ Do not lift heavy objects. °¨ Do not stand for long periods of time. °¨ Quit smoking. This should keep you from developing a frequent cough. °SEEK MEDICAL CARE IF:  °· A bulge develops in your groin area. °· You feel pain, a burning sensation or pressure in the groin. This might be worse if you are lifting or straining. °· You develop a fever of more than 100.5° F (38.1° C). °SEEK IMMEDIATE MEDICAL CARE IF:  °· Pain in the groin increases suddenly. °· A bulge in the groin gets bigger suddenly and does not go down. °· For men, there is sudden pain in the scrotum. Or, the size of the scrotum increases. °· A bulge in the groin area becomes red or purple and is painful to touch. °· You have nausea or vomiting that does not go away. °· You feel your heart beating much faster than normal. °· You cannot have a bowel movement or pass gas. °· You develop a fever of more than 102.0° F (38.9° C). °  °This information is not intended to replace advice given to you by your health care provider. Make sure you discuss any questions you have with your health care provider. °  °Document Released: 03/05/2009 Document Revised: 01/09/2012 Document Reviewed: 04/20/2015 °Elsevier Interactive Patient Education ©2016 [employer] ° °

## 2016-01-12 ENCOUNTER — Encounter (HOSPITAL_COMMUNITY): Payer: Self-pay | Admitting: Surgery

## 2017-04-03 ENCOUNTER — Institutional Professional Consult (permissible substitution): Payer: Self-pay | Admitting: Medical

## 2017-04-04 ENCOUNTER — Ambulatory Visit (INDEPENDENT_AMBULATORY_CARE_PROVIDER_SITE_OTHER): Payer: BLUE CROSS/BLUE SHIELD | Admitting: Medical

## 2017-04-04 ENCOUNTER — Encounter: Payer: Self-pay | Admitting: Medical

## 2017-04-04 VITALS — BP 130/80 | HR 88 | Resp 16 | Ht 75.5 in | Wt 278.6 lb

## 2017-04-04 DIAGNOSIS — R4586 Emotional lability: Secondary | ICD-10-CM

## 2017-04-04 DIAGNOSIS — R5383 Other fatigue: Secondary | ICD-10-CM | POA: Diagnosis not present

## 2017-04-04 DIAGNOSIS — R451 Restlessness and agitation: Secondary | ICD-10-CM | POA: Diagnosis not present

## 2017-04-04 DIAGNOSIS — F39 Unspecified mood [affective] disorder: Secondary | ICD-10-CM | POA: Diagnosis not present

## 2017-04-04 DIAGNOSIS — F329 Major depressive disorder, single episode, unspecified: Secondary | ICD-10-CM

## 2017-04-04 DIAGNOSIS — R4589 Other symptoms and signs involving emotional state: Secondary | ICD-10-CM

## 2017-04-04 NOTE — Patient Instructions (Addendum)
It was a pleasure to meet you today  Recommendations: 1- establish appointment with a counseling 2-Give yourself more credit for the accomplishments you have achieved 3-before you see a counseling, I recommend you write down what you want to achieve with couseling, what goals you have for the next year, next 2 years, next 5 years 4- make sure you talk to your wife about your goals and her goals so you can grow together these next few years 5- find a way to get back into church 6-pray, put God first, and ask your self if your goals align with God's will for your life!   Counseling Services   [facility] [address], [place], [place] [address] [phone]    [facility] [phone] [address] [place], [place], [place] [address]   Verneda Skill. Clarene Reamer, therapist [phone] [address] [place], [place] [address]   [facility] [phone] [address], [place], [place] [address]   Vic Ripper, therapist [phone] [address], [place], [place] [address]   [facility] [address] [place], [place], [place] [address]
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
## 2017-04-24 ENCOUNTER — Encounter: Payer: Self-pay | Admitting: Medical

## 2017-04-24 ENCOUNTER — Ambulatory Visit (INDEPENDENT_AMBULATORY_CARE_PROVIDER_SITE_OTHER): Payer: BLUE CROSS/BLUE SHIELD | Admitting: Medical

## 2017-04-24 VITALS — BP 132/84 | HR 71 | Wt 274.6 lb

## 2017-04-24 DIAGNOSIS — R4586 Emotional lability: Secondary | ICD-10-CM

## 2017-04-24 DIAGNOSIS — Z131 Encounter for screening for diabetes mellitus: Secondary | ICD-10-CM

## 2017-04-24 DIAGNOSIS — E66811 Obesity, class 1: Secondary | ICD-10-CM

## 2017-04-24 DIAGNOSIS — Z0000 Encounter for general adult medical examination without abnormal findings: Secondary | ICD-10-CM | POA: Insufficient documentation

## 2017-04-24 DIAGNOSIS — E669 Obesity, unspecified: Secondary | ICD-10-CM

## 2017-04-24 DIAGNOSIS — F39 Unspecified mood [affective] disorder: Secondary | ICD-10-CM

## 2017-04-24 DIAGNOSIS — R454 Irritability and anger: Secondary | ICD-10-CM

## 2017-04-24 DIAGNOSIS — Z87820 Personal history of traumatic brain injury: Secondary | ICD-10-CM | POA: Diagnosis not present

## 2017-04-24 DIAGNOSIS — F321 Major depressive disorder, single episode, moderate: Secondary | ICD-10-CM

## 2017-04-24 DIAGNOSIS — Z1211 Encounter for screening for malignant neoplasm of colon: Secondary | ICD-10-CM | POA: Diagnosis not present

## 2017-04-24 NOTE — Progress Notes (Signed)
  Subjective:  [name] is a 56 y.o. male who presents for f/u. I saw him recently as a new patient. Chief Complaint  Patient presents with  . discuss mri or ct scan    discuss mri or ct scan of brain   Was seeing [facility] prior.  Sees Dr. Gaynelle Arabian at [facility].   Last visit he noted a lot of hardship in his life over time, and lately decided it was time to work on his own mental health.  Since last visit he established with counseling at [facility] group.  He has his next visit this week.   He notes that the counselor wanted MRI brain given hx/o concussions and football and other contact sports in his earlier years that could have impact on his mood concerns.    See last visit regarding mental health concerns.   No other aggravating or relieving factors.    No other c/o.  The following portions of the patient's history were reviewed and updated as appropriate: allergies, current medications, past family history, past medical history, past social history, past surgical history and problem list.  ROS Otherwise as in subjective above  Objective:Exam BP 132/84   Pulse 71   Wt 274 lb 9.6 oz (124.6 kg)   SpO2 99%   BMI 33.87 kg/m   General appearance: alert, no distress, WD/WN Psych: pleasant, seems a little more confident today than last visit   Assessment: Encounter Diagnoses  Name Primary?  . Mood change ([place]) Yes  . Irritability   . History of concussion   . Depression, major, single episode, moderate ([place])   . Screening for diabetes mellitus   . Obesity (BMI 30.0-34.9)     Plan: Glad he has established with counseling.  Next appt this week.   Given concern for prior concussions and possible prior traumatic brain injury, will set up for brain MRI at request of counseling. He sold the property recently which has taken some stress off him.  C/t efforts to get his mental health concerns in order.   F/u pending MRI And return soon for physical and fasting  labs.  [name] was seen today for discuss mri or ct scan.  Diagnoses and all orders for this visit:  Mood change ([place]) -     MR Brain Wo Contrast; Future  Irritability -     MR Brain Wo Contrast; Future  History of concussion -     MR Brain Wo Contrast; Future  Depression, major, single episode, moderate ([facility]) -     MR Brain Wo Contrast; Future  Screening for diabetes mellitus -     MR Brain Wo Contrast; Future  Obesity (BMI 30.0-34.9) -     MR Brain Wo Contrast; Future

## 2017-04-25 ENCOUNTER — Telehealth: Payer: Self-pay

## 2017-04-25 ENCOUNTER — Encounter: Payer: Self-pay | Admitting: Gastroenterology

## 2017-04-25 NOTE — Telephone Encounter (Signed)
Refer to GI for screening colonoscopy preferably within 30 days due to insurance time crunch

## 2017-04-25 NOTE — Addendum Note (Signed)
Addended by: Tyrone Apple on: 04/25/2017 02:01 PM   Modules accepted: Orders

## 2017-04-25 NOTE — Telephone Encounter (Signed)
Sent referral 

## 2017-04-25 NOTE — Telephone Encounter (Signed)
Pt's wife would like pt to have colonoscopy in next 30 days due to changes in insurance. CB [phone]. Victorino December

## 2017-04-28 ENCOUNTER — Telehealth: Payer: Self-pay | Admitting: *Deleted

## 2017-04-28 NOTE — Telephone Encounter (Signed)
John, please let me know after you review. Thanks

## 2017-04-28 NOTE — Telephone Encounter (Signed)
Please try to schedule the procedure within 30 days, I think I have some available appt for screening colonoscopy. Thanks

## 2017-04-28 NOTE — Telephone Encounter (Signed)
Patient was a difficult intubation for hernia repair in 2017. He also has a history of malignant hyperthermia.   OV? Hospital?  Please advise.

## 2017-04-28 NOTE — Telephone Encounter (Signed)
[name] and Dr. Silverio Decamp,  I have reviewed the notes from this pt's hernia repair and found the following:  Procedure Name: Intubation Date/Time: 01/11/2016 1:57 PM Performed by: Tressia Miners LEFFEW Pre-anesthesia Checklist: Patient identified, Patient being monitored, Timeout performed, Emergency Drugs available and Suction available Patient Re-evaluated:Patient Re-evaluated prior to inductionOxygen Delivery Method: Circle System Utilized Preoxygenation: Pre-oxygenation with 100% oxygen Intubation Type: IV induction Ventilation: Mask ventilation without difficulty Laryngoscope Size: Mac and 4 Grade View: Grade I Tube type: Oral Tube size: 7.5 mm Number of attempts: 1 Airway Equipment and Method: Stylet Placement Confirmation: ETT inserted through vocal cords under direct vision,  positive ETCO2 and breath sounds checked- equal and bilateral Secured at: 23 cm Tube secured with: Tape Dental Injury: Teeth and Oropharynx as per pre-operative assessment    Consequently he was intubated without difficulty and is not a difficult intubation risk.  Thanks,  Osvaldo Angst

## 2017-05-09 ENCOUNTER — Ambulatory Visit: Payer: Self-pay | Admitting: *Deleted

## 2017-05-09 ENCOUNTER — Encounter (INDEPENDENT_AMBULATORY_CARE_PROVIDER_SITE_OTHER): Payer: Self-pay

## 2017-05-09 VITALS — Ht 74.0 in | Wt 277.0 lb

## 2017-05-09 NOTE — Progress Notes (Signed)
Patient's PV 05/09/17 appt was cancelled.  Patient did not have insurance coverage.  Colon to be rescheduled when he has insurance.

## 2017-05-10 ENCOUNTER — Encounter: Payer: Self-pay | Admitting: Medical

## 2017-05-17 ENCOUNTER — Encounter: Payer: BLUE CROSS/BLUE SHIELD | Admitting: Medical

## 2017-05-17 DIAGNOSIS — Z23 Encounter for immunization: Secondary | ICD-10-CM

## 2017-05-17 NOTE — Telephone Encounter (Signed)

## 2017-05-18 ENCOUNTER — Encounter: Payer: Self-pay | Admitting: Medical

## 2017-05-18 NOTE — Telephone Encounter (Signed)
No show letter with fee sent 05/18/2017

## 2017-05-18 NOTE — Telephone Encounter (Signed)
Call to inquire why he no showed.  Send no show letter and charge as well.

## 2017-05-19 ENCOUNTER — Encounter: Payer: BC Managed Care – PPO | Admitting: Gastroenterology

## 2017-06-01 ENCOUNTER — Telehealth: Payer: Self-pay | Admitting: Family Medicine

## 2017-06-01 NOTE — Telephone Encounter (Signed)
Wife called and states husband has been diagnosed with [facility] [name].  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  [phone]

## 2017-06-01 NOTE — Telephone Encounter (Signed)
Lets have him return to discuss medications.   See if counselor will send records

## 2017-06-06 NOTE — Telephone Encounter (Signed)
pts wife called and soon as she gets his schedule she will get him in here

## 2017-06-09 ENCOUNTER — Telehealth: Payer: Self-pay | Admitting: Medical

## 2017-06-09 NOTE — Telephone Encounter (Signed)
Lets have him and wife come in.  He was suppose to have MRI brain.  What is status of this?

## 2017-06-09 NOTE — Telephone Encounter (Signed)
Called and spoke with [facility] the order was pt in as external  and didn't get p/u. When in fixed order

## 2017-06-09 NOTE — Telephone Encounter (Signed)
Pt's spouse, Arville Go, called sting that "pt is in bad shape". 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 Wife wants to know if there is something over the counter that he can take. Please call Joann back at [phone]

## 2017-06-14 ENCOUNTER — Ambulatory Visit: Payer: BLUE CROSS/BLUE SHIELD | Admitting: Medical

## 2017-06-24 ENCOUNTER — Other Ambulatory Visit: Payer: Self-pay

## 2017-07-28 ENCOUNTER — Telehealth: Payer: Self-pay | Admitting: Medical

## 2017-07-28 NOTE — Telephone Encounter (Signed)
Pt's wife called to see if Audelia Acton was ready to prescribe a med for pt now that Audelia Acton has reviewed records from [facility]. Wife said pt is in need of med asap. 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

## 2017-07-31 NOTE — Telephone Encounter (Signed)
Per our conversation earlier, he no showed last visit, and early August phone message advised f/u to discuss

## 2017-07-31 NOTE — Telephone Encounter (Signed)
Wife called back to see if a decision was made on meds for the pt or if he needed to come in for an appt. Spoke to [place] and he needs to see the pt. Made an appt for Wednesday 08/02/17

## 2017-08-02 ENCOUNTER — Encounter: Payer: Self-pay | Admitting: Medical

## 2017-08-02 ENCOUNTER — Ambulatory Visit (INDEPENDENT_AMBULATORY_CARE_PROVIDER_SITE_OTHER): Payer: PRIVATE HEALTH INSURANCE | Admitting: Medical

## 2017-08-02 VITALS — BP 150/108 | HR 67 | Wt 278.0 lb

## 2017-08-02 DIAGNOSIS — R454 Irritability and anger: Secondary | ICD-10-CM

## 2017-08-02 DIAGNOSIS — F321 Major depressive disorder, single episode, moderate: Secondary | ICD-10-CM | POA: Diagnosis not present

## 2017-08-02 DIAGNOSIS — R4586 Emotional lability: Secondary | ICD-10-CM | POA: Diagnosis not present

## 2017-08-02 MED ORDER — FLUOXETINE HCL 20 MG PO TABS: 20.0000 mg | ORAL_TABLET | Freq: Every day | ORAL | 1 refills | Status: DC

## 2017-08-02 NOTE — Progress Notes (Signed)
Subjective:  [name] is a 56 y.o. male who presents for f/u Chief Complaint  Patient presents with  . discuss psych meds    discuss psych meds   Was seeing [facility] prior.  Here accompanied by wife today.   Here to f/u on depression, mood changes.   Since last visit here has had 2 visits at [facility] for counseling.  Here to start medications to help with mood. Of note, this is his 3rd visit with me. He no showed his physical visit with me recently.   He recently switched jobs so had an insurance changes  Last visit he noted a lot of hardship in his life over time, and lately decided it was time to work on his own mental health.   He spent time going back over the concerns he had the last 2 visits.   Wife notes that at times he can be down and depressed, other times he can get loud or argumentative.   At times he can threaten to pack his bags and leave the house.    Other times he has has possible delusions.   He denies SI or HI.  He still continues to be down most of the time, recognizing things have to change to get to a better mental state.  Wife says he seems to be angry with everything in general, not satisfied, searching for answers.   Prior history from last 2 visits: He notes being in pretty good physical health, but he did have a prostatectomy 10 years ago due to prostate cancer.  This changed his life, has felt that the experience took a lot out of him.   He has never fully recovered.  He notes problems with his mental state.  Ready to get his mental /emotional life in order and turn a new page in his life.  Years ago friends told him to go and get put on medication to deal with anxiety but he never did.  Always has been more of a macho man, dealing with things his way, without reliance on others.  However he feels this has taken a toll on him.    His father wasn't much of a father.  His grandfather was mean to him.   The only people he ever received anything from  financially or emotionally was women in his family.  The only survivors in his family older than him are women.   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  Married, wife is his biggest support.  He is working swing shifts for [employer], works early morning to afternoon, then again in the evening. Sleep is not always good.  Wants to retire in the next 5 years.  He feels he deserves an easier way now that he has spent his life providing for others.  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   No other aggravating or relieving factors.     No other c/o.  The following portions of the patient's history were reviewed and updated as appropriate: allergies, current medications, past family history, past medical history, past social history, past surgical history and problem list.  ROS Otherwise as in subjective above  Objective:Exam  BP (!) 150/108   Pulse 67   Wt 278 lb (126.1 kg)   SpO2 98%   BMI 35.69 kg/m   BP Readings from Last 3 Encounters:  08/02/17 (!) 150/108  04/24/17 132/84  04/04/17 130/80    General appearance: alert, no distress, WD/WN Psych: seems sad, down   Assessment: Encounter Diagnoses  Name Primary?  . Depression, major, single episode, moderate ([place]) Yes  . Irritability   . Mood change     Plan: Spent over 45 minutes in face to face discussion and counseling.   reviewed the 2 [facility] notes we received from counseling.   We discussed the need to stick with counseling, restart counseling since his insurance changes.  Discussed need for he and his wife to be patient with each other as he works on his on mental health issues.   I recommended they get the 5 Love Languages book to work on their relationship.   We discussed work scheduled,  exercise, stress reduction and working on nurturing relationships that are important to him.    discussed medications options.   Begin trial of Fluoxetine.  Discussed risks and benefits of medication, time frame to see improvements.   discussed symptom that would prompt urgent recheck.  Discussed wife's concerns about how to react when he is angry, frustrated.   We discussed ways to work on these conversations.   I will plan to see him back in 3-4 wk, sooner prn.  Spent > 45 minutes face to face with patient in discussion of symptoms, evaluation, plan and recommendations.

## 2017-08-24 ENCOUNTER — Ambulatory Visit: Payer: PRIVATE HEALTH INSURANCE | Admitting: Medical

## 2017-09-24 ENCOUNTER — Other Ambulatory Visit: Payer: Self-pay | Admitting: Medical

## 2017-09-25 ENCOUNTER — Telehealth: Payer: Self-pay | Admitting: Medical

## 2017-09-25 NOTE — Telephone Encounter (Signed)
Can pt have a refill on meds  

## 2017-09-25 NOTE — Telephone Encounter (Signed)
Left message for to come in for a follow up

## 2017-09-25 NOTE — Telephone Encounter (Signed)
I sent refill on prozac, but get in for f/u

## 2017-09-28 ENCOUNTER — Other Ambulatory Visit: Payer: Self-pay | Admitting: Medical

## 2017-09-28 NOTE — Telephone Encounter (Signed)
Is this ok to refill?  

## 2017-10-06 ENCOUNTER — Encounter: Payer: Self-pay | Admitting: Gastroenterology

## 2017-12-04 ENCOUNTER — Telehealth: Payer: Self-pay | Admitting: Medical

## 2017-12-04 NOTE — Telephone Encounter (Signed)
He needs an appt 

## 2017-12-04 NOTE — Telephone Encounter (Signed)
Pt's wife, Arville Go, called stating that Prozac dosage is no longer effective quite as effective for pt. They are noticing that pt is back to having the same symptoms that he was having initially: mind wondering, little hallucination. Med worked great at first. Arville Go said that Audelia Acton mentioned that Prozac may need to be adjusted. Call Joann at [phone]

## 2017-12-05 NOTE — Telephone Encounter (Signed)
Called wife ad advised her that pt needs an appt. They will call back to schedule an appt.

## 2017-12-06 ENCOUNTER — Telehealth: Payer: Self-pay | Admitting: Medical

## 2017-12-06 ENCOUNTER — Other Ambulatory Visit: Payer: Self-pay | Admitting: Medical

## 2017-12-06 MED ORDER — FLUOXETINE HCL (PMDD) 20 MG PO TABS: 1.0000 | ORAL_TABLET | Freq: Every day | ORAL | 0 refills | Status: DC

## 2017-12-06 NOTE — Telephone Encounter (Signed)
Pt's wife called to make an appt for pt tomorrow. Audelia Acton is completely booked. She was unsure of his work schedule so was unable to make another appt.  Pt will be out of prozac tomorrow and she is concerned with him not taking it. Please advise Joann.

## 2017-12-06 NOTE — Telephone Encounter (Signed)
Pt's wife, Arville Go, called stating they are having difficulty getting pt scheduled to come in to see Audelia Acton to discuss the adjustments that are needed to his current Prozac prescription because of pt's work schedule. He needs a refill on Prozac now. Can he get a refill or a few Prozac pills for now until he can get in for the appointment? Call Joann back at [phone].

## 2017-12-06 NOTE — Telephone Encounter (Signed)
I sent a refill on the medication for 30 days.  At his last visit we discussed follow-up appointment within 3-4 weeks, and on prior phone calls he was again reminded for follow-up.  Thus, he should not wait to the last minute to call for an appointment  Get in for recheck

## 2017-12-07 NOTE — Telephone Encounter (Signed)
Can pt have a refill on med 

## 2017-12-07 NOTE — Telephone Encounter (Signed)
Called and left message with Shane's instructions

## 2018-01-02 ENCOUNTER — Telehealth: Payer: Self-pay | Admitting: Medical

## 2018-01-02 MED ORDER — FLUOXETINE HCL (PMDD) 20 MG PO TABS: 1.0000 | ORAL_TABLET | Freq: Every day | ORAL | 0 refills | Status: DC

## 2018-01-02 NOTE — Telephone Encounter (Signed)
Called an l/m for pt to call us back.

## 2018-01-02 NOTE — Telephone Encounter (Signed)
Ok to give him 30 day refill of Prozac and then he can follow up with Audelia Acton.

## 2018-01-02 NOTE — Telephone Encounter (Signed)
Pt's wife called to make an appt today to see Audelia Acton for follow up on Prozac. Pt informed Audelia Acton is not in and we can only see acutes. She states he only knows his schedule a day in advance so can't schedule anything else. PT only has 2 days left of medication. She is requesting it be sent in for him due to scheduling difficulties with Audelia Acton and [place] out. [facility] [place]. PT's wife can be reached at [phone].  Sending to both [place].

## 2018-01-03 NOTE — Telephone Encounter (Signed)
Called and spoke with pt about setting up an appt , he said that  It hard for him to come in for appt because of  Job, I explain to him that we are will to work with on appt. That he needs to make an appt in order to for him to keep getting refills on his meds. Made an appt for pt come in , I told if something come up to call us in enough time r/s at more convention time for him.

## 2018-01-04 ENCOUNTER — Other Ambulatory Visit: Payer: Self-pay | Admitting: Medical

## 2018-01-05 ENCOUNTER — Telehealth: Payer: Self-pay

## 2018-01-05 NOTE — Telephone Encounter (Signed)
Dr. Redmond School fill pt fluoxetine and it was sent in as tablet. Pt called to let us know he needed capsules. Pharmacy was made aware of change in form of med. Pt also has a appt the 25th of march with shane > [place]

## 2018-01-22 ENCOUNTER — Encounter: Payer: Self-pay | Admitting: Medical

## 2018-01-22 ENCOUNTER — Ambulatory Visit (INDEPENDENT_AMBULATORY_CARE_PROVIDER_SITE_OTHER): Payer: Commercial Managed Care - PPO | Admitting: Medical

## 2018-01-22 VITALS — BP 124/88 | HR 74 | Wt 286.8 lb

## 2018-01-22 DIAGNOSIS — F321 Major depressive disorder, single episode, moderate: Secondary | ICD-10-CM

## 2018-01-22 DIAGNOSIS — Z1211 Encounter for screening for malignant neoplasm of colon: Secondary | ICD-10-CM | POA: Diagnosis not present

## 2018-01-22 MED ORDER — FLUOXETINE HCL 20 MG PO CAPS: 20.0000 mg | ORAL_CAPSULE | Freq: Two times a day (BID) | ORAL | 0 refills | Status: DC

## 2018-01-22 MED ORDER — FLUOXETINE HCL 20 MG PO CAPS: 20.0000 mg | ORAL_CAPSULE | Freq: Two times a day (BID) | ORAL | 1 refills | Status: DC

## 2018-01-22 NOTE — Progress Notes (Signed)
Subjective:  [name] is a 57 y.o. male who presents for f/u Chief Complaint  Patient presents with  . follow-up on meds    follow-up on meds   Here for f/u on depression, medication Fluoxetine 20mg .  Accompanied by wife.  Got a Yorkie dog, and this helps keep the house from feeling empty.   The medication definately helps.  Feels like the medication wears off in afternoon.   He sometimes works late schedules, 12 hours.  Wants the medication to last longer in the day or stronger dose.   Not currently seeing counseling.   He ran out of insurance in the fall and didn't go back to counseling after that.   Wife says he is defiantly doing better.   Feels like the capsule works quicker than the tablet.     Overall is in a much better place now than 6 months ago.  Relationship with son is improving.  Is more intentional with relationship with wife.   Less irritable now, happier countenance, people want to be around him more.  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   Prior history: He notes being in pretty good physical health, but he did have a prostatectomy 10 years ago due to prostate cancer.  This changed his life, has felt that the experience took a lot out of him.   He has never fully recovered.  He notes problems with his mental state.  Ready to get his mental /emotional life in order and turn a new page in his life.  Years ago friends told him to go and get put on medication to deal with anxiety but he never did.  Always has been more of a macho man, dealing with things his way, without reliance on others.  However he feels this has taken a toll on him.    His father wasn't much of a father.  His grandfather was mean to him.   The only people he ever received anything from financially or emotionally was women in his family.  The only survivors in his family older than him are women.   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  Married, wife is his biggest support.  He is working swing shifts for [employer], works early morning to afternoon, then again in the evening. Sleep is not always good.  Wants to retire in the next 5 years.  He feels he deserves an easier way now that he has spent his life providing for others.  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   No other aggravating or relieving factors.     No other c/o.  The following portions of the patient's history were reviewed and updated as appropriate: allergies, current medications, past family history, past medical history, past social history, past surgical history and problem list.  ROS Otherwise as in subjective above    Objective:Exam BP 124/88   Pulse 74   Wt 286 lb 12.8 oz (130.1 kg)   BMI 36.82 kg/m   BP Readings from Last 3 Encounters:  01/22/18 124/88  08/02/17 (!) 150/108  04/24/17 132/84   General  appearance: alert, no distress, WD/WN Psych:  Pleasant, better eye contact and conversation that prior visits, answers questions appropriately   Assessment: Encounter Diagnoses  Name Primary?  . Depression, major, single episode, moderate ([place]) Yes  . Screen for colon cancer      Plan: Much improved.  We discussed short and long term goals, discussed medications, focusing on day to day plans.   We will try changing to Fluoxetine 40mg .  He wants to try BID for a few weeks to see how this works for him.   After 2-3 weeks, can try 2 tablets once daily to see if he notices any difference.   He has seen improvement on the medication.   Advised him to call back in 62mo so we can refill the appropriate dose.  Wished him early happy birthday.  Return soon for physical  Referred for first screening colonoscopy  Spent > 45 minutes face to  face with patient in discussion of symptoms, evaluation, plan and recommendations.

## 2018-01-25 ENCOUNTER — Encounter: Payer: Self-pay | Admitting: Internal Medicine

## 2018-01-26 ENCOUNTER — Telehealth: Payer: Self-pay | Admitting: Medical

## 2018-01-26 NOTE — Telephone Encounter (Signed)
Pt's wife called requesting that CPE appointment be changed from June to this Monday 01/29/18 because he is having some issues that need to be addressed and need to have CPE asap. Wife will not be able to come in with pt on Monday and want Audelia Acton to know that pt is still having some ptsd symptoms and due to playing football in the past they have discussed that maybe he should have a brain scan, he also still has major trust issues and during events like birthdays, cookouts or gatherings he "freaks out and goes off the deep end" where his "bad side" and ager shows. Wife wanted to make sure that you know this in case pt forget to tell you this

## 2018-01-26 NOTE — Telephone Encounter (Signed)
I reviewed the comments.   Work him in when there is a slot, but try NOT to triple up physicals if possible.

## 2018-01-29 ENCOUNTER — Encounter: Payer: Commercial Managed Care - PPO | Admitting: Medical

## 2018-02-02 ENCOUNTER — Other Ambulatory Visit: Payer: Self-pay | Admitting: Family Medicine

## 2018-02-02 NOTE — Telephone Encounter (Signed)
Your patient 

## 2018-02-02 NOTE — Telephone Encounter (Signed)
[facility] in [place] is requesting to fill pt prozac. Please advise. [place]

## 2018-02-22 ENCOUNTER — Encounter: Payer: Self-pay | Admitting: Medical

## 2018-03-05 ENCOUNTER — Encounter: Payer: Commercial Managed Care - PPO | Admitting: Medical

## 2018-03-09 ENCOUNTER — Encounter: Payer: Self-pay | Admitting: Medical

## 2018-04-16 ENCOUNTER — Ambulatory Visit (INDEPENDENT_AMBULATORY_CARE_PROVIDER_SITE_OTHER): Payer: Commercial Managed Care - PPO | Admitting: Medical

## 2018-04-16 ENCOUNTER — Encounter: Payer: Commercial Managed Care - PPO | Admitting: Medical

## 2018-04-16 ENCOUNTER — Encounter: Payer: Self-pay | Admitting: Medical

## 2018-04-16 VITALS — BP 146/100 | HR 62 | Resp 16 | Ht 76.0 in | Wt 286.8 lb

## 2018-04-16 DIAGNOSIS — Z1211 Encounter for screening for malignant neoplasm of colon: Secondary | ICD-10-CM

## 2018-04-16 DIAGNOSIS — E669 Obesity, unspecified: Secondary | ICD-10-CM | POA: Diagnosis not present

## 2018-04-16 DIAGNOSIS — Z7185 Encounter for immunization safety counseling: Secondary | ICD-10-CM | POA: Insufficient documentation

## 2018-04-16 DIAGNOSIS — R030 Elevated blood-pressure reading, without diagnosis of hypertension: Secondary | ICD-10-CM

## 2018-04-16 DIAGNOSIS — F321 Major depressive disorder, single episode, moderate: Secondary | ICD-10-CM

## 2018-04-16 DIAGNOSIS — Z1159 Encounter for screening for other viral diseases: Secondary | ICD-10-CM

## 2018-04-16 DIAGNOSIS — Z0000 Encounter for general adult medical examination without abnormal findings: Secondary | ICD-10-CM | POA: Diagnosis not present

## 2018-04-16 DIAGNOSIS — Z7189 Other specified counseling: Secondary | ICD-10-CM | POA: Diagnosis not present

## 2018-04-16 LAB — POCT URINALYSIS DIP (PROADVANTAGE DEVICE)
BILIRUBIN UA: NEGATIVE
BILIRUBIN UA: NEGATIVE mg/dL
Glucose, UA: NEGATIVE mg/dL
Leukocytes, UA: NEGATIVE
NITRITE UA: NEGATIVE
PH UA: 6 (ref 5.0–8.0)
Protein Ur, POC: NEGATIVE mg/dL
RBC UA: NEGATIVE
Specific Gravity, Urine: 1.03
Urobilinogen, Ur: NEGATIVE

## 2018-04-16 NOTE — Progress Notes (Signed)
Subjective:   HPI  [name] is a 57 y.o. male who presents for Chief Complaint  Patient presents with  . Annual Exam    fasting cpe   vision done resently    Here accompanied by wife.      Medical care team includes: Udell Mazzocco, Camelia Eng, PA-C here for primary care Dentist Eye doctor  Prior consults with the following during prior issues with prostate cancer Dr. Raynelle Bring, Urology Dr. Michael Boston, general surgery Dr. Terrence Dupont, cardiology   Concerns: Last Td vaccine about 4 years ago.    He notes prior issues with anesthesia with hernia surgery back in 2017.  BP elevated today - he notes he does check sometimes at pharmacy, sometimes normal, sometimes not.   No issues with chest pain, swelling, dyspnea.    He reports that he is improving overall with his mental health.  Not currently seeing counselor or using the medication from last viist.  He says he doesn't need the medication now.   (wife told nurse that he does need something)  Reviewed their medical, surgical, family, social, medication, and allergy history and updated chart as appropriate.  Past Medical History:  Diagnosis Date  . Cancer ([place])    hx prostate ca 2008;  Dr. Raynelle Bring  . Complication of anesthesia    took awhile to awaken surgery 2011 hernia    Past Surgical History:  Procedure Laterality Date  . HERNIA REPAIR  3818   umbilical, left  . INGUINAL HERNIA REPAIR Bilateral 01/11/2016   Procedure: LAPAROSCOPIC BILATERAL INGUINAL HERNIA REPAIR WITH MESH;  Surgeon: Michael Boston, MD;  Location: [facility];  Service: General;  Laterality: Bilateral;  . LYSIS OF ADHESION Bilateral 01/11/2016   Procedure: LYSIS OF ADHESION;  Surgeon: Michael Boston, MD;  Location: [facility];  Service: General;  Laterality: Bilateral;  . PROSTATE SURGERY  2008   cancer    Social History   Socioeconomic History  . Marital status: Married    Spouse name: Not on file  . Number of children: Not on file  . Years of  education: Not on file  . Highest education level: Not on file  Occupational History  . Not on file  Social Needs  . Financial resource strain: Not on file  . Food insecurity:    Worry: Not on file    Inability: Not on file  . Transportation needs:    Medical: Not on file    Non-medical: Not on file  Tobacco Use  . Smoking status: Never Smoker  . Smokeless tobacco: Never Used  Substance and Sexual Activity  . Alcohol use: No  . Drug use: No  . Sexual activity: Not on file  Lifestyle  . Physical activity:    Days per week: Not on file    Minutes per session: Not on file  . Stress: Not on file  Relationships  . Social connections:    Talks on phone: Not on file    Gets together: Not on file    Attends religious service: Not on file    Active member of club or organization: Not on file    Attends meetings of clubs or organizations: Not on file    Relationship status: Not on file  . Intimate partner violence:    Fear of current or ex partner: Not on file    Emotionally abused: Not on file    Physically abused: Not on file    Forced sexual activity: Not on file  Other Topics Concern  . Not on file  Social History Narrative   Married.  Bus operator, [place], walking for exercise.   1 child.   03/2018.      Family History  Problem Relation Age of Onset  . Hypertension Mother   . Cancer Mother        breast  . Stroke Father   . Cancer Sister        thyroid  . Kidney disease Maternal Uncle   . Cancer Maternal Uncle        prostate  . Diabetes Neg Hx   . Heart disease Neg Hx      Current Outpatient Medications:  .  Ascorbic Acid (VITAMIN C) 1000 MG tablet, Take 1,000 mg by mouth daily., Disp: , Rfl:  .  aspirin EC 81 MG tablet, Take 81 mg by mouth daily., Disp: , Rfl:  .  b complex vitamins tablet, Take 1 tablet by mouth daily., Disp: , Rfl:  .  calcium-vitamin D (OSCAL WITH D) 250-125 MG-UNIT tablet, Take 1 tablet by mouth daily., Disp: , Rfl:  .  FLUoxetine (PROZAC)  20 MG capsule, Take 1 capsule (20 mg total) by mouth 2 (two) times daily. (Patient not taking: Reported on 04/16/2018), Disp: 60 capsule, Rfl: 3  No Known Allergies   Review of Systems Constitutional: -fever, -chills, -sweats, -unexpected weight change, -decreased appetite, -fatigue Allergy: -sneezing, -itching, -congestion Dermatology: -changing moles, --rash, -lumps ENT: -runny nose, -ear pain, -sore throat, -hoarseness, -sinus pain, -teeth pain, - ringing in ears, -hearing loss, -nosebleeds Cardiology: -chest pain, -palpitations, -swelling, -difficulty breathing when lying flat, -waking up short of breath Respiratory: -cough, -shortness of breath, -difficulty breathing with exercise or exertion, -wheezing, -coughing up blood Gastroenterology: -abdominal pain, -nausea, -vomiting, -diarrhea, -constipation, -blood in stool, -changes in bowel movement, -difficulty swallowing or eating Hematology: -bleeding, -bruising  Musculoskeletal: -joint aches, -muscle aches, -joint swelling, -back pain, -neck pain, -cramping, -changes in gait Ophthalmology: denies vision changes, eye redness, itching, discharge Urology: -burning with urination, -difficulty urinating, -blood in urine, -urinary frequency, -urgency, -incontinence Neurology: -headache, -weakness, -tingling, -numbness, -memory loss, -falls, -dizziness Psychology: -depressed mood, -agitation, -sleep problems Male GU: no testicular mass, pain, no lymph nodes swollen, no swelling, no rash.     Objective:  BP (!) 146/100   Pulse 62   Resp 16   Ht 6\' 4"  (1.93 m)   Wt 286 lb 12.8 oz (130.1 kg)   SpO2 98%   BMI 34.91 kg/m   BP Readings from Last 3 Encounters:  04/16/18 (!) 146/100  01/22/18 124/88  08/02/17 (!) 150/108   General appearance: alert, no distress, WD/WN, African American male Skin: unremarkable HEENT: normocephalic, conjunctiva/corneas normal, sclerae anicteric, PERRLA, EOMi, nares patent, no discharge or erythema, pharynx  normal Oral cavity: MMM, tongue normal, teeth normal Neck: supple, no lymphadenopathy, no thyromegaly, no masses, normal ROM, no bruits Chest: non tender, normal shape and expansion Heart: RRR, normal S1, S2, no murmurs Lungs: CTA bilaterally, no wheezes, rhonchi, or rales Abdomen: +bs, soft, umbilical and inguinal hernia scars.  Otherwise non tender, non distended, no masses, no hepatomegaly, no splenomegaly, no bruits Back: non tender, normal ROM, no scoliosis Musculoskeletal: upper extremities non tender, no obvious deformity, normal ROM throughout, lower extremities non tender, no obvious deformity, normal ROM throughout Extremities: no edema, no cyanosis, no clubbing Pulses: 2+ symmetric, upper and lower extremities, normal cap refill Neurological: alert, oriented x 3, CN2-12 intact, strength normal upper extremities and lower extremities, sensation normal throughout,  DTRs 2+ throughout, no cerebellar signs, gait normal Psychiatric: normal affect, behavior normal, pleasant  GU: normal male external genitalia,circumcised, nontender, no masses, no hernia, no lymphadenopathy Rectal: declined   Adult ECG Report  Indication: physical, elevated BP  Rate: 60 bpm  Rhythm: normal sinus rhythm and sinus arrhythmia  QRS Axis: 55 degrees  PR Interval: 139ms   QRS Duration: 17ms  QTc: 494ms  Conduction Disturbances: none  Other Abnormalities: none  Patient's cardiac risk factors are: advanced age (older than 43 for men, 21 for women) and obesity (BMI >= 30 kg/m2).  EKG comparison: none  Narrative Interpretation: sinus arrhythmia    Assessment and Plan :   Encounter Diagnoses  Name Primary?  . Encounter for health maintenance examination in adult Yes  . Vaccine counseling   . Need for Tdap vaccination   . Screen for colon cancer   . Encounter for hepatitis C screening test for low risk patient   . Depression, major, single episode, moderate ([place])   . Obesity with serious comorbidity,  unspecified classification, unspecified obesity type   . Elevated blood-pressure reading without diagnosis of hypertension     Physical exam - discussed and counseled on healthy lifestyle, diet, exercise, preventative care, vaccinations, sick and well care, proper use of emergency dept and after hours care, and addressed their concerns.    Health screening: See your eye doctor yearly for routine vision care. See your dentist yearly for routine dental care including hygiene visits twice yearly.  Cancer screening Colonoscopy:  Referred for screening colonoscopy  Vaccinations: Advised yearly influenza vaccine  Counseled on Shingles vaccine at age 55 years and older Patient will check insurance coverage for this and consider vaccination  Counseled on Td vaccine or Tdap vaccine every 10 years Will request copy of prior Td vaccine.   Acute issues discussed: Elevated BP - advised he check home BPs and get me readings in 2 weeks.  Separate significant chronic issues discussed: Depression - counseled on continuing efforts to work on goal setting, working on relationships, spiritual growth.   He has come a long way since last year. He declines medication or counseling at this point.  I advised he still consider maybe trial of Wellbutrin since he gained weight on SSRI.  I am concerned and wife is also that he is not ready to do this on his own without counseling or medication but he declines today.  He seems a bit stubborn on his willingness to continue with medication or counseling.       Obesity - counseled on diet, exercise, weight loss recommendations   Craigory was seen today for annual exam.  Diagnoses and all orders for this visit:  Encounter for health maintenance examination in adult -     Comprehensive metabolic panel -     CBC -     Lipid panel -     PSA -     Hemoglobin A1c -     Hepatitis C antibody -     HIV antibody -     Ambulatory referral to Gastroenterology -      EKG 12-Lead  Vaccine counseling  Need for Tdap vaccination  Screen for colon cancer  Encounter for hepatitis C screening test for low risk patient -     Hepatitis C antibody  Depression, major, single episode, moderate ([place])  Obesity with serious comorbidity, unspecified classification, unspecified obesity type  Elevated blood-pressure reading without diagnosis of hypertension -     EKG 12-Lead  Follow-up pending labs, yearly for physical

## 2018-04-16 NOTE — Patient Instructions (Addendum)
Thanks for trusting [place] with your health care and for coming in for a physical today.  Below are some general recommendations I have for you:  Yearly screenings See your eye doctor yearly for routine vision care. See your dentist yearly for routine dental care including hygiene visits twice yearly. See me here yearly for a routine physical and preventative care visit   Specific Concerns today:  . Shingles vaccine:  I recommend you have a shingles vaccine to help prevent shingles or herpes zoster outbreak.   Please call your insurer to inquire about coverage for the Shingrix vaccine given in 2 doses.   Some insurers cover this vaccine after age 57, some cover this after age 42.  If your insurer covers this, then call to schedule appointment to have this vaccine here. . I recommend a referral to gastroenterology for colon cancer screening . We will call with lab results   Please follow up yearly for a physical.   Preventative Care for Adults - Male      [place]:  A routine yearly physical is a good way to check in with your primary care provider about your health and preventive screening. It is also an opportunity to share updates about your health and any concerns you have, and receive a thorough all-over exam.   Most health insurance companies pay for at least some preventative services.  Check with your health plan for specific coverages.  WHAT PREVENTATIVE SERVICES DO WOMEN NEED?  Adult men should have their weight and blood pressure checked regularly.   Men age 62 and older should have their cholesterol levels checked regularly.  Beginning at age 16 and continuing to age 81, men should be screened for colorectal cancer.  Certain people may need continued testing until age 34.  Updating vaccinations is part of preventative care.  Vaccinations help protect against diseases such as the flu.  Osteoporosis is a disease in which the bones lose minerals and  strength as we age. Men ages 16 and over should discuss this with their caregivers  Lab tests are generally done as part of preventative care to screen for anemia and blood disorders, to screen for problems with the kidneys and liver, to screen for bladder problems, to check blood sugar, and to check your cholesterol level.  Preventative services generally include counseling about diet, exercise, avoiding tobacco, drugs, excessive alcohol consumption, and sexually transmitted infections.    GENERAL RECOMMENDATIONS FOR GOOD HEALTH:  Healthy diet:  Eat a variety of foods, including fruit, vegetables, animal or vegetable protein, such as meat, fish, chicken, and eggs, or beans, lentils, tofu, and grains, such as rice.  Drink plenty of water daily.  Decrease saturated fat in the diet, avoid lots of red meat, processed foods, sweets, fast foods, and fried foods.  Exercise:  Aerobic exercise helps maintain good heart health. At least 30-40 minutes of moderate-intensity exercise is recommended. For example, a brisk walk that increases your heart rate and breathing. This should be done on most days of the week.   Find a type of exercise or a variety of exercises that you enjoy so that it becomes a part of your daily life.  Examples are running, walking, swimming, water aerobics, and biking.  For motivation and support, explore group exercise such as aerobic class, spin class, Zumba, Yoga,or  martial arts, etc.    Set exercise goals for yourself, such as a certain weight goal, walk or run in a race such as  a 5k walk/run.  Speak to your primary care provider about exercise goals.  Disease prevention:  If you smoke or chew tobacco, find out from your caregiver how to quit. It can literally save your life, no matter how long you have been a tobacco user. If you do not use tobacco, never begin.   Maintain a healthy diet and normal weight. Increased weight leads to problems with blood pressure and  diabetes.   The Body Mass Index or BMI is a way of measuring how much of your body is fat. Having a BMI above 27 increases the risk of heart disease, diabetes, hypertension, stroke and other problems related to obesity. Your caregiver can help determine your BMI and based on it develop an exercise and dietary program to help you achieve or maintain this important measurement at a healthful level.  High blood pressure causes heart and blood vessel problems.  Persistent high blood pressure should be treated with medicine if weight loss and exercise do not work.   Fat and cholesterol leaves deposits in your arteries that can block them. This causes heart disease and vessel disease elsewhere in your body.  If your cholesterol is found to be high, or if you have heart disease or certain other medical conditions, then you may need to have your cholesterol monitored frequently and be treated with medication.   Ask if you should have a cardiac stress test if your history suggests this. A stress test is a test done on a treadmill that looks for heart disease. This test can find disease prior to there being a problem.  Osteoporosis is a disease in which the bones lose minerals and strength as we age. This can result in serious bone fractures. Risk of osteoporosis can be identified using a bone density scan. Men ages 16 and over should discuss this with their caregivers. Ask your caregiver whether you should be taking a calcium supplement and Vitamin D, to reduce the rate of osteoporosis.   Avoid drinking alcohol in excess (more than two drinks per day).  Avoid use of street drugs. Do not share needles with anyone. Ask for professional help if you need assistance or instructions on stopping the use of alcohol, cigarettes, and/or drugs.  Brush your teeth twice a day with fluoride toothpaste, and floss once a day. Good oral hygiene prevents tooth decay and gum disease. The problems can be painful, unattractive, and  can cause other health problems. Visit your dentist for a routine oral and dental check up and preventive care every 6-12 months.   Look at your skin regularly.  Use a mirror to look at your back. Notify your caregivers of changes in moles, especially if there are changes in shapes, colors, a size larger than a pencil eraser, an irregular border, or development of new moles.  Safety:  Use seatbelts 100% of the time, whether driving or as a passenger.  Use safety devices such as hearing protection if you work in environments with loud noise or significant background noise.  Use safety glasses when doing any work that could send debris in to the eyes.  Use a helmet if you ride a bike or motorcycle.  Use appropriate safety gear for contact sports.  Talk to your caregiver about gun safety.  Use sunscreen with a SPF (or skin protection factor) of 15 or greater.  Lighter skinned people are at a greater risk of skin cancer. Don't forget to also wear sunglasses in order to protect your eyes  from too much damaging sunlight. Damaging sunlight can accelerate cataract formation.   Practice safe sex. Use condoms. Condoms are used for birth control and to help reduce the spread of sexually transmitted infections (or STIs).  Some of the STIs are gonorrhea (the clap), chlamydia, syphilis, trichomonas, herpes, HPV (human papilloma virus) and HIV (human immunodeficiency virus) which causes AIDS. The herpes, HIV and HPV are viral illnesses that have no cure. These can result in disability, cancer and death.   Keep carbon monoxide and smoke detectors in your home functioning at all times. Change the batteries every 6 months or use a model that plugs into the wall.   Vaccinations:  Stay up to date with your tetanus shots and other required immunizations. You should have a booster for tetanus every 10 years. Be sure to get your flu shot every year, since 5%-20% of the [place] population comes down with the flu. The flu  vaccine changes each year, so being vaccinated once is not enough. Get your shot in the fall, before the flu season peaks.   Other vaccines to consider:  Human Papilloma Virus or HPV causes cancer of the cervix, and other infections that can be transmitted from person to person. There is a vaccine for HPV, and males should get immunized between the ages of 55 and 36. It requires a series of 3 shots.   Pneumococcal vaccine to protect against certain types of pneumonia.  This is normally recommended for adults age 67 or older.  However, adults younger than 57 years old with certain underlying conditions such as diabetes, heart or lung disease should also receive the vaccine.  Shingles vaccine to protect against Varicella Zoster if you are older than age 72, or younger than 57 years old with certain underlying illness.  If you have not had the Shingrix vaccine, please call your insurer to inquire about coverage for the Shingrix vaccine given in 2 doses.   Some insurers cover this vaccine after age 68, some cover this after age 75.  If your insurer covers this, then call to schedule appointment to have this vaccine here  Hepatitis A vaccine to protect against a form of infection of the liver by a virus acquired from food.  Hepatitis B vaccine to protect against a form of infection of the liver by a virus acquired from blood or body fluids, particularly if you work in health care.  If you plan to travel internationally, check with your local health department for specific vaccination recommendations.   What should I know about cancer screening? Many types of cancers can be detected early and may often be prevented. Lung Cancer  You should be screened every year for lung cancer if: ? You are a current smoker who has smoked for at least 30 years. ? You are a former smoker who has quit within the past 15 years.  Talk to your health care provider about your screening options, when you should start  screening, and how often you should be screened.  Colorectal Cancer  Routine colorectal cancer screening usually begins at 57 years of age and should be repeated every 5-10 years until you are 57 years old. You may need to be screened more often if early forms of precancerous polyps or small growths are found. Your health care provider may recommend screening at an earlier age if you have risk factors for colon cancer.  Your health care provider may recommend using home test kits to check for hidden blood in  the stool.  A small camera at the end of a tube can be used to examine your colon (sigmoidoscopy or colonoscopy). This checks for the earliest forms of colorectal cancer.  Prostate and Testicular Cancer  Depending on your age and overall health, your health care provider may do certain tests to screen for prostate and testicular cancer.  Talk to your health care provider about any symptoms or concerns you have about testicular or prostate cancer.  Skin Cancer  Check your skin from head to toe regularly.  Tell your health care provider about any new moles or changes in moles, especially if: ? There is a change in a mole's size, shape, or color. ? You have a mole that is larger than a pencil eraser.  Always use sunscreen. Apply sunscreen liberally and repeat throughout the day.  Protect yourself by wearing long sleeves, pants, a wide-brimmed hat, and sunglasses when outside.

## 2018-04-17 LAB — COMPREHENSIVE METABOLIC PANEL
ALT: 20 IU/L (ref 0–44)
AST: 12 IU/L (ref 0–40)
Albumin/Globulin Ratio: 1.7 (ref 1.2–2.2)
Albumin: 4.3 g/dL (ref 3.5–5.5)
Alkaline Phosphatase: 71 IU/L (ref 39–117)
BILIRUBIN TOTAL: 0.4 mg/dL (ref 0.0–1.2)
BUN/Creatinine Ratio: 11 (ref 9–20)
BUN: 14 mg/dL (ref 6–24)
CALCIUM: 9.6 mg/dL (ref 8.7–10.2)
CHLORIDE: 104 mmol/L (ref 96–106)
CO2: 26 mmol/L (ref 20–29)
Creatinine, Ser: 1.25 mg/dL (ref 0.76–1.27)
GFR calc Af Amer: 73 mL/min/{1.73_m2} (ref >59)
GFR, EST NON AFRICAN AMERICAN: 64 mL/min/{1.73_m2} (ref >59)
GLOBULIN, TOTAL: 2.5 g/dL (ref 1.5–4.5)
Glucose: 104 mg/dL — ABNORMAL HIGH (ref 65–99)
Potassium: 4.5 mmol/L (ref 3.5–5.2)
SODIUM: 143 mmol/L (ref 134–144)
Total Protein: 6.8 g/dL (ref 6.0–8.5)

## 2018-04-17 LAB — CBC
Hematocrit: 43.5 % (ref 37.5–51.0)
Hemoglobin: 15.1 g/dL (ref 13.0–17.7)
MCH: 30.6 pg (ref 26.6–33.0)
MCHC: 34.7 g/dL (ref 31.5–35.7)
MCV: 88 fL (ref 79–97)
Platelets: 260 10*3/uL (ref 150–450)
RBC: 4.93 x10E6/uL (ref 4.14–5.80)
RDW: 14.6 % (ref 12.3–15.4)
WBC: 4.3 10*3/uL (ref 3.4–10.8)

## 2018-04-17 LAB — LIPID PANEL
CHOLESTEROL TOTAL: 265 mg/dL — AB (ref 100–199)
Chol/HDL Ratio: 4.1 ratio (ref 0.0–5.0)
HDL: 64 mg/dL (ref >39)
LDL Calculated: 172 mg/dL — ABNORMAL HIGH (ref 0–99)
TRIGLYCERIDES: 144 mg/dL (ref 0–149)
VLDL Cholesterol Cal: 29 mg/dL (ref 5–40)

## 2018-04-17 LAB — HEMOGLOBIN A1C
ESTIMATED AVERAGE GLUCOSE: 123 mg/dL
HEMOGLOBIN A1C: 5.9 % — AB (ref 4.8–5.6)

## 2018-04-17 LAB — HEPATITIS C ANTIBODY: Hep C Virus Ab: 0.1 s/co ratio (ref 0.0–0.9)

## 2018-04-17 LAB — PSA: Prostate Specific Ag, Serum: <0.1 ng/mL (ref 0.0–4.0)

## 2018-04-17 LAB — HIV ANTIBODY (ROUTINE TESTING W REFLEX): HIV SCREEN 4TH GENERATION: NONREACTIVE

## 2018-04-19 ENCOUNTER — Encounter: Payer: Self-pay | Admitting: Internal Medicine

## 2018-06-18 ENCOUNTER — Encounter: Payer: Self-pay | Admitting: Medical

## 2018-07-12 ENCOUNTER — Other Ambulatory Visit: Payer: Self-pay | Admitting: Medical

## 2018-07-12 MED ORDER — FLUOXETINE HCL 20 MG PO CAPS: 20.0000 mg | ORAL_CAPSULE | Freq: Two times a day (BID) | ORAL | 1 refills | Status: DC

## 2018-07-19 ENCOUNTER — Telehealth: Payer: Self-pay | Admitting: Medical

## 2018-07-19 NOTE — Telephone Encounter (Signed)
At this point, not sure he needs FMLA for anything.  He is due for f/u on BP and depression, so schedule med check.  Find out what the reason is for requesting FMLA?

## 2018-07-19 NOTE — Telephone Encounter (Signed)
Pt's had someone dropped off fmla forms to be completed. Sending back to be completed. Please call [phone] when ready and it will be picked up. Sending to Cindi per office protocol.

## 2018-07-20 NOTE — Telephone Encounter (Signed)
Pt made an appt for Tuesday 07/24/2018

## 2018-07-20 NOTE — Telephone Encounter (Signed)
I will keep the forms at my desk until his appointment.

## 2018-07-24 ENCOUNTER — Encounter: Payer: Self-pay | Admitting: Medical

## 2018-07-24 ENCOUNTER — Encounter: Payer: Commercial Managed Care - PPO | Admitting: Medical

## 2018-07-24 ENCOUNTER — Ambulatory Visit (INDEPENDENT_AMBULATORY_CARE_PROVIDER_SITE_OTHER): Payer: Commercial Managed Care - PPO | Admitting: Medical

## 2018-07-24 VITALS — BP 142/90 | HR 64 | Temp 97.5°F | Resp 16 | Ht 75.0 in | Wt 294.2 lb

## 2018-07-24 DIAGNOSIS — Z2821 Immunization not carried out because of patient refusal: Secondary | ICD-10-CM

## 2018-07-24 DIAGNOSIS — F419 Anxiety disorder, unspecified: Secondary | ICD-10-CM | POA: Diagnosis not present

## 2018-07-24 DIAGNOSIS — R454 Irritability and anger: Secondary | ICD-10-CM | POA: Diagnosis not present

## 2018-07-24 DIAGNOSIS — R4586 Emotional lability: Secondary | ICD-10-CM

## 2018-07-24 DIAGNOSIS — F324 Major depressive disorder, single episode, in partial remission: Secondary | ICD-10-CM

## 2018-07-24 NOTE — Progress Notes (Signed)
Subjective:  [name] is a 57 y.o. male who presents for f/u Chief Complaint  Patient presents with  . forms    disability forms completion   Here for FMLA paperwork.  This is his first request for FMLA.   He comes in for follow up on medication for depression and anxiety.   He notes that he may sometimes feel the need to not go to work if overwhelmed.   He is still trying to work out his breakdown moment when he first saw me about a year ago.   He is working full time, but says sometimes work is stressful and demanding serving the public.  Hasn't had a vacation in 2 years until recently.    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     He is doing overall pretty well, compliant with his medication, and handling things well.  He does get the occasional day where he feels overwhelmed and needs to take a day away from work.  He also reserves his vacation days for this as well.  He just wants to protect his job.    He still sees his pastor for counseling some.  He saw a Multimedia programmer this past year.   Declines flu shot.  No other aggravating or relieving factors.     No other c/o.  The following portions of the patient's history were reviewed and updated as appropriate: allergies, current medications, past family history, past medical history, past social history, past surgical history and problem list.   Past Medical History:  Diagnosis Date  . Cancer ([place])    hx prostate ca 2008;  Dr. Raynelle Bring  . Complication of anesthesia    took awhile to awaken surgery 2011 hernia   Current Outpatient Medications on File Prior to Visit  Medication Sig Dispense Refill  . Ascorbic Acid (VITAMIN C) 1000 MG tablet Take 1,000 mg by mouth daily.    Marland Kitchen aspirin EC 81 MG tablet Take 81 mg by mouth daily.    Marland Kitchen b complex vitamins tablet Take 1 tablet by mouth daily.    . calcium-vitamin D (OSCAL WITH D) 250-125  MG-UNIT tablet Take 1 tablet by mouth daily.    Marland Kitchen FLUoxetine (PROZAC) 20 MG capsule Take 1 capsule (20 mg total) by mouth 2 (two) times daily. 60 capsule 1   No current facility-administered medications on file prior to visit.     ROS Otherwise as in subjective above    Objective:Exam BP (!) 142/90   Pulse 64   Temp (!) 97.5 F (36.4 C) (Oral)   Resp 16   Ht 6\' 3"  (1.905 m)   Wt 294 lb 3.2 oz (133.4 kg)   SpO2 99%   BMI 36.77 kg/m   BP Readings from Last 3 Encounters:  07/24/18 (!) 142/90  04/16/18 (!) 146/100  01/22/18 124/88   General appearance: alert, no distress, WD/WN Psych:  Pleasant, better eye contact and conversation that prior visits, answers questions appropriately   Assessment: Encounter Diagnoses  Name Primary?  . Depression, major, single episode, in partial remission ([place]) Yes  . Irritability   . Mood change   . Anxiety   . Influenza vaccination declined      Plan: Discussed his concerns, he is in partial remission, still taking Prozac.   I advised he continue counseling.   Discussed using vacation days as needed for mental health  holidays.  Discussed limited use of days on FMLA.   If needs more days, needs to see counseling for further discussion.    Completed his FMLA paperwork  Declines flu shot.

## 2018-08-02 ENCOUNTER — Institutional Professional Consult (permissible substitution): Payer: Commercial Managed Care - PPO | Admitting: Medical

## 2018-08-13 ENCOUNTER — Telehealth: Payer: Self-pay | Admitting: Medical

## 2018-08-13 ENCOUNTER — Encounter: Payer: Self-pay | Admitting: Internal Medicine

## 2018-08-13 ENCOUNTER — Other Ambulatory Visit: Payer: Self-pay

## 2018-08-13 DIAGNOSIS — Z1211 Encounter for screening for malignant neoplasm of colon: Secondary | ICD-10-CM

## 2018-08-13 NOTE — Telephone Encounter (Signed)
pls check on this

## 2018-08-13 NOTE — Telephone Encounter (Signed)
Patient's wife notified of referral being sent.

## 2018-08-13 NOTE — Telephone Encounter (Signed)
Wife called and stated that pt needs another referral to [facility]. She called to schedule and they stated another referral would be needed. Wife can be reached at [phone].

## 2018-08-13 NOTE — Telephone Encounter (Signed)
Referral sent to [facility].

## 2018-09-12 ENCOUNTER — Ambulatory Visit (AMBULATORY_SURGERY_CENTER): Payer: Self-pay

## 2018-09-12 VITALS — Ht 75.0 in | Wt 288.0 lb

## 2018-09-12 DIAGNOSIS — Z1211 Encounter for screening for malignant neoplasm of colon: Secondary | ICD-10-CM

## 2018-09-12 MED ORDER — NA SULFATE-K SULFATE-MG SULF 17.5-3.13-1.6 GM/177ML PO SOLN: 1.0000 | Freq: Once | ORAL | 0 refills | Status: AC

## 2018-09-12 NOTE — Progress Notes (Signed)
Per pt, no allergies to soy or egg products.Pt not taking any weight loss meds or using  O2 at home.  Pt refused emmi video. 

## 2018-09-13 ENCOUNTER — Encounter: Payer: Self-pay | Admitting: Internal Medicine

## 2018-09-21 ENCOUNTER — Encounter: Payer: Self-pay | Admitting: Internal Medicine

## 2018-09-21 ENCOUNTER — Ambulatory Visit (AMBULATORY_SURGERY_CENTER): Payer: Commercial Managed Care - PPO | Admitting: Internal Medicine

## 2018-09-21 VITALS — BP 134/102 | HR 66 | Temp 98.0°F | Resp 14 | Ht 75.0 in | Wt 288.0 lb

## 2018-09-21 DIAGNOSIS — D123 Benign neoplasm of transverse colon: Secondary | ICD-10-CM | POA: Diagnosis not present

## 2018-09-21 DIAGNOSIS — Z1211 Encounter for screening for malignant neoplasm of colon: Secondary | ICD-10-CM | POA: Diagnosis not present

## 2018-09-21 DIAGNOSIS — D120 Benign neoplasm of cecum: Secondary | ICD-10-CM

## 2018-09-21 MED ORDER — SODIUM CHLORIDE 0.9 % IV SOLN: 500.0000 mL | Freq: Once | INTRAVENOUS | Status: DC

## 2018-09-21 NOTE — Op Note (Signed)
[name] Patient Name: [name] Procedure Date: 09/21/2018 12:55 PM MRN: [id number] Endoscopist: Docia Chuck. Henrene Pastor , MD Age: 57 Referring MD:  Date of Birth: 1961-04-01 Gender: Male Account #: [id number] Procedure:                Colonoscopy with cold snare polypectomy x 3 Indications:              Screening for colorectal malignant neoplasm Medicines:                Monitored Anesthesia Care Procedure:                Pre-Anesthesia Assessment:                           - Prior to the procedure, a History and Physical                            was performed, and patient medications and                            allergies were reviewed. The patient's tolerance of                            previous anesthesia was also reviewed. The risks                            and benefits of the procedure and the sedation                            options and risks were discussed with the patient.                            All questions were answered, and informed consent                            was obtained. Prior Anticoagulants: The patient has                            taken no previous anticoagulant or antiplatelet                            agents. ASA Grade Assessment: I - A normal, healthy                            patient. After reviewing the risks and benefits,                            the patient was deemed in satisfactory condition to                            undergo the procedure.                           After obtaining informed consent, the colonoscope  was passed under direct vision. Throughout the                            procedure, the patient's blood pressure, pulse, and                            oxygen saturations were monitored continuously. The                            Colonoscope was introduced through the anus and                            advanced to the the cecum, identified by   appendiceal orifice and ileocecal valve. The                            ileocecal valve, appendiceal orifice, and rectum                            were photographed. The quality of the bowel                            preparation was good. The colonoscopy was performed                            without difficulty. The patient tolerated the                            procedure well. The bowel preparation used was                            SUPREP. Scope In: 4:16:60 PM Scope Out: 1:34:12 PM Scope Withdrawal Time: 0 hours 15 minutes 0 seconds  Total Procedure Duration: 0 hours 17 minutes 53 seconds  Findings:                 Three polyps were found in the transverse colon and                            cecum. The polyps were 1 to 5 mm in size. These                            polyps were removed with a cold snare. Resection                            and retrieval were complete.                           Internal hemorrhoids were found during retroflexion.                           The exam was otherwise without abnormality on                            direct and retroflexion views.  Complications:            No immediate complications. Estimated blood loss:                            None. Estimated Blood Loss:     Estimated blood loss: none. Impression:               - Three 1 to 5 mm polyps in the transverse colon                            and in the cecum, removed with a cold snare.                            Resected and retrieved.                           - Internal hemorrhoids.                           - The examination was otherwise normal on direct                            and retroflexion views. Recommendation:           - Repeat colonoscopy in 3 - 5 years for                            surveillance.                           - Patient has a contact number available for                            emergencies. The signs and symptoms of potential                             delayed complications were discussed with the                            patient. Return to normal activities tomorrow.                            Written discharge instructions were provided to the                            patient.                           - Resume previous diet.                           - Continue present medications.                           - Await pathology results. Docia Chuck. Henrene Pastor, MD 09/21/2018 1:41:59 PM This report has been signed electronically.

## 2018-09-21 NOTE — Progress Notes (Signed)
A/ox3 pleased with MAC, report to RN 

## 2018-09-21 NOTE — Progress Notes (Signed)
Called to room to assist during endoscopic procedure.  Patient ID and intended procedure confirmed with present staff. Received instructions for my participation in the procedure from the performing physician.  

## 2018-09-21 NOTE — Patient Instructions (Signed)
Handouts given on polyps. You had 3 removed today.   YOU HAD AN ENDOSCOPIC PROCEDURE TODAY AT [facility]:   Refer to the procedure report that was given to you for any specific questions about what was found during the examination.  If the procedure report does not answer your questions, please call your gastroenterologist to clarify.  If you requested that your care partner not be given the details of your procedure findings, then the procedure report has been included in a sealed envelope for you to review at your convenience later.  YOU SHOULD EXPECT: Some feelings of bloating in the abdomen. Passage of more gas than usual.  Walking can help get rid of the air that was put into your GI tract during the procedure and reduce the bloating. If you had a lower endoscopy (such as a colonoscopy or flexible sigmoidoscopy) you may notice spotting of blood in your stool or on the toilet paper. If you underwent a bowel prep for your procedure, you may not have a normal bowel movement for a few days.  Please Note:  You might notice some irritation and congestion in your nose or some drainage.  This is from the oxygen used during your procedure.  There is no need for concern and it should clear up in a day or so.  SYMPTOMS TO REPORT IMMEDIATELY:   Following lower endoscopy (colonoscopy or flexible sigmoidoscopy):  Excessive amounts of blood in the stool  Significant tenderness or worsening of abdominal pains  Swelling of the abdomen that is new, acute  Fever of 100F or higher   For urgent or emergent issues, a gastroenterologist can be reached at any hour by calling [phone].   DIET:  We do recommend a small meal at first, but then you may proceed to your regular diet.  Drink plenty of fluids but you should avoid alcoholic beverages for 24 hours.  ACTIVITY:  You should plan to take it easy for the rest of today and you should NOT DRIVE or use heavy machinery until tomorrow  (because of the sedation medicines used during the test).    FOLLOW UP: Our staff will call the number listed on your records the next business day following your procedure to check on you and address any questions or concerns that you may have regarding the information given to you following your procedure. If we do not reach you, we will leave a message.  However, if you are feeling well and you are not experiencing any problems, there is no need to return our call.  We will assume that you have returned to your regular daily activities without incident.  If any biopsies were taken you will be contacted by phone or by letter within the next 1-3 weeks.  Please call us at [phone] if you have not heard about the biopsies in 3 weeks.    SIGNATURES/CONFIDENTIALITY: You and/or your care partner have signed paperwork which will be entered into your electronic medical record.  These signatures attest to the fact that that the information above on your After Visit Summary has been reviewed and is understood.  Full responsibility of the confidentiality of this discharge information lies with you and/or your care-partner.

## 2018-09-24 ENCOUNTER — Telehealth: Payer: Self-pay

## 2018-09-24 ENCOUNTER — Telehealth: Payer: Self-pay | Admitting: *Deleted

## 2018-09-24 NOTE — Telephone Encounter (Signed)
Attempted to reach pt. With follow-up call following endoscopic procedure 09/21/2018.  LM on pt. Voice mail to call if he has any questions or concerns.

## 2018-09-24 NOTE — Telephone Encounter (Signed)
First attempt, left VM.  

## 2018-09-26 ENCOUNTER — Encounter: Payer: Self-pay | Admitting: Internal Medicine

## 2018-11-08 ENCOUNTER — Other Ambulatory Visit: Payer: Self-pay | Admitting: Medical

## 2019-01-17 DIAGNOSIS — Z0279 Encounter for issue of other medical certificate: Secondary | ICD-10-CM

## 2019-06-04 ENCOUNTER — Other Ambulatory Visit: Payer: Self-pay | Admitting: Medical

## 2019-06-04 NOTE — Telephone Encounter (Signed)
Is this ok to refill?  

## 2019-06-05 NOTE — Telephone Encounter (Signed)
Give #30  Days and get in for CPX

## 2019-06-28 ENCOUNTER — Telehealth: Payer: Self-pay | Admitting: Medical

## 2019-06-28 ENCOUNTER — Other Ambulatory Visit: Payer: Self-pay | Admitting: Medical

## 2019-06-28 NOTE — Telephone Encounter (Signed)
Please call patient's wife.  She emailed me a long email message through my chart.   Her phone number is [phone].    He is due for a physical which prompted our recent call to get him in for appointment  Based on her notes to me, he has severe depression.  He really needs to see a psychiatrist.  I am not a psychiatrist.  I think it is past time that he needs to see a mental health professional and licensed counselor as well.  Please email my list of counselors and psychiatrist.  I strongly recommend they make an appointment with a psychiatrist ASAP as well as a counselor which is a separate appointment   [facility] in [place], [place]  If you are experiencing a mental health crisis or an emergency, please call 911 or go to the nearest emergency department.  [facility]   [phone] [facility]  [phone] [facility]   [phone]  Suicide Hotline 1-800-Suicide 504-190-1080)  National Suicide Prevention Lifeline 2058314557  (919)700-4138)  Domestic Violence, Rape/Crisis - [place] (912) 877-9488  The [employer] Violence Hotline 1-800-799-SAFE (408)516-9267)  To report Child or Elder Abuse, please call: [facility] Police Department  [phone] [facility] Department  [place] [phone]  Teen Crisis line [phone] or [phone]     Psychiatry and Counseling services  [facility] [place], [place], [place] [address] [phone]  Lina Sayre, therapist Dr. Lynder Parents, psychiatrist Dr. Milana Huntsman, child psychiatrist   Dr. Launa Flight [address], [place], [place] [address] [phone]   Dr. Chucky May, psychiatry [address] [place], [place] [address] [phone]   [place], [place], [place] [address] [phone]   [facility] [place], [place], [place] [address] [phone]    Counseling Services (NON- psychiatrist offices)  [facility] [address] [place], [place] [address] [phone]   [facility] [phone], [place], [place] [address]   [facility] [phone]  [email or web address] [address], [place], [place], [place] [address]   Merrianne M. Clarene Reamer, therapist [phone] [address] [place], [place] [address]   [facility] [phone] [address], [place], [place] [address]   Vic Ripper, therapist [phone] [address] [place], [place] [address]   [facility] [address] [place], [place], [place] [address]

## 2019-07-01 NOTE — Telephone Encounter (Signed)
Pt wife was informed of provider message. Pt has an appointment on Wednesday and shew would like it to be discussed with patient during that time that he needs to see a Psychiatrist and a counselor. Please advise.

## 2019-07-03 ENCOUNTER — Encounter: Payer: Self-pay | Admitting: Medical

## 2019-07-24 ENCOUNTER — Other Ambulatory Visit: Payer: Self-pay | Admitting: Medical

## 2019-07-28 ENCOUNTER — Other Ambulatory Visit: Payer: Self-pay | Admitting: Medical

## 2019-09-01 ENCOUNTER — Other Ambulatory Visit: Payer: Self-pay | Admitting: Medical

## 2019-09-03 ENCOUNTER — Encounter: Payer: Self-pay | Admitting: Medical

## 2019-09-03 ENCOUNTER — Other Ambulatory Visit: Payer: Self-pay

## 2019-09-04 ENCOUNTER — Encounter: Payer: Self-pay | Admitting: Medical

## 2019-09-09 ENCOUNTER — Telehealth: Payer: Self-pay

## 2019-09-09 NOTE — Telephone Encounter (Signed)
You can send it back that he needs appt and needs to review the letter I sent him.  Please make sure we sent the letter I type up.  Check with [facility] that we sent it.

## 2019-09-09 NOTE — Telephone Encounter (Signed)
Received fax from [facility]. For Fluoxetine 20mg  #60 last filled 06/28/19 pt.last apt. 07/24/18, pt. Had apt. 09/03/19 but no showed and canceled an apt. On 07/03/19.

## 2019-09-10 NOTE — Telephone Encounter (Signed)
Audelia Acton wanted me to check with you to see if a letter he typed up was sent to this patient.

## 2019-09-10 NOTE — Telephone Encounter (Signed)
Done

## 2019-10-01 ENCOUNTER — Ambulatory Visit: Payer: Self-pay | Admitting: Family Medicine

## 2021-09-16 ENCOUNTER — Encounter: Payer: Self-pay | Admitting: Internal Medicine

## 2022-07-06 ENCOUNTER — Encounter: Payer: Self-pay | Admitting: Internal Medicine

## 2022-08-09 ENCOUNTER — Encounter: Payer: Self-pay | Admitting: Internal Medicine

## 2023-02-24 ENCOUNTER — Ambulatory Visit: Payer: Commercial Managed Care - PPO | Admitting: Nurse Practitioner

## 2023-03-16 ENCOUNTER — Ambulatory Visit: Payer: Commercial Managed Care - PPO | Admitting: Nurse Practitioner

## 2023-09-02 ENCOUNTER — Emergency Department: Payer: BLUE CROSS/BLUE SHIELD

## 2023-09-02 ENCOUNTER — Observation Stay
Admission: EM | Admit: 2023-09-02 | Discharge: 2023-09-03 | Disposition: A | Discharge status: Home / Self Care | Payer: BLUE CROSS/BLUE SHIELD | Attending: Emergency Medicine | Admitting: Emergency Medicine

## 2023-09-02 ENCOUNTER — Other Ambulatory Visit: Payer: Self-pay

## 2023-09-02 DIAGNOSIS — F418 Other specified anxiety disorders: Secondary | ICD-10-CM | POA: Diagnosis present

## 2023-09-02 DIAGNOSIS — R262 Difficulty in walking, not elsewhere classified: Secondary | ICD-10-CM

## 2023-09-02 DIAGNOSIS — E66811 Obesity, class 1: Secondary | ICD-10-CM | POA: Diagnosis present

## 2023-09-02 DIAGNOSIS — Z7982 Long term (current) use of aspirin: Secondary | ICD-10-CM | POA: Diagnosis not present

## 2023-09-02 DIAGNOSIS — Z79899 Other long term (current) drug therapy: Secondary | ICD-10-CM | POA: Insufficient documentation

## 2023-09-02 DIAGNOSIS — R42 Dizziness and giddiness: Secondary | ICD-10-CM | POA: Diagnosis present

## 2023-09-02 DIAGNOSIS — Z8546 Personal history of malignant neoplasm of prostate: Secondary | ICD-10-CM | POA: Diagnosis not present

## 2023-09-02 LAB — COMPREHENSIVE METABOLIC PANEL
ALT: 21 U/L (ref 0–44)
AST: 19 U/L (ref 15–41)
Albumin: 4.4 g/dL (ref 3.5–5.0)
Alkaline Phosphatase: 56 U/L (ref 38–126)
Anion gap: 10 (ref 5–15)
BUN: 14 mg/dL (ref 8–23)
CO2: 20 mmol/L — ABNORMAL LOW (ref 22–32)
Calcium: 9.2 mg/dL (ref 8.9–10.3)
Chloride: 106 mmol/L (ref 98–111)
Creatinine, Ser: 0.8 mg/dL (ref 0.61–1.24)
GFR, Estimated: >60 mL/min (ref >60)
Glucose, Bld: 168 mg/dL — ABNORMAL HIGH (ref 70–99)
Potassium: 4.1 mmol/L (ref 3.5–5.1)
Sodium: 136 mmol/L (ref 135–145)
Total Bilirubin: 0.8 mg/dL (ref 0.3–1.2)
Total Protein: 7.2 g/dL (ref 6.5–8.1)

## 2023-09-02 LAB — CBC
HCT: 44.4 % (ref 39.0–52.0)
Hemoglobin: 15 g/dL (ref 13.0–17.0)
MCH: 30.7 pg (ref 26.0–34.0)
MCHC: 33.8 g/dL (ref 30.0–36.0)
MCV: 90.8 fL (ref 80.0–100.0)
Platelets: 308 10*3/uL (ref 150–400)
RBC: 4.89 MIL/uL (ref 4.22–5.81)
RDW: 12.6 % (ref 11.5–15.5)
WBC: 3.9 10*3/uL — ABNORMAL LOW (ref 4.0–10.5)
nRBC: 0 % (ref 0.0–0.2)

## 2023-09-02 LAB — DIFFERENTIAL
Abs Immature Granulocytes: 0.01 10*3/uL (ref 0.00–0.07)
Basophils Absolute: 0 10*3/uL (ref 0.0–0.1)
Basophils Relative: 1 %
Eosinophils Absolute: 0 10*3/uL (ref 0.0–0.5)
Eosinophils Relative: 1 %
Immature Granulocytes: 0 %
Lymphocytes Relative: 24 %
Lymphs Abs: 1 10*3/uL (ref 0.7–4.0)
Monocytes Absolute: 0.3 10*3/uL (ref 0.1–1.0)
Monocytes Relative: 9 %
Neutro Abs: 2.6 10*3/uL (ref 1.7–7.7)
Neutrophils Relative %: 65 %

## 2023-09-02 LAB — APTT: aPTT: 26 s (ref 24–36)

## 2023-09-02 LAB — PROTIME-INR
INR: 1.1 (ref 0.8–1.2)
Prothrombin Time: 13.9 s (ref 11.4–15.2)

## 2023-09-02 LAB — ETHANOL: Alcohol, Ethyl (B): <10 mg/dL (ref <10)

## 2023-09-02 MED ORDER — SODIUM CHLORIDE 0.9% FLUSH: 3.0000 mL | Freq: Once | INTRAVENOUS | Status: DC

## 2023-09-02 MED ORDER — ONDANSETRON HCL 4 MG/2ML IJ SOLN
4.0000 mg | Freq: Once | INTRAMUSCULAR | Status: AC
  Administered 2023-09-02: 4 mg via INTRAVENOUS
  Filled 2023-09-02: qty 2

## 2023-09-02 MED ORDER — IOHEXOL 350 MG/ML SOLN
75.0000 mL | Freq: Once | INTRAVENOUS | Status: AC | PRN
  Administered 2023-09-02: 75 mL via INTRAVENOUS

## 2023-09-02 MED ORDER — METHYLPREDNISOLONE SODIUM SUCC 125 MG IJ SOLR
125.0000 mg | Freq: Once | INTRAMUSCULAR | Status: AC
  Administered 2023-09-02: 125 mg via INTRAVENOUS
  Filled 2023-09-02: qty 2

## 2023-09-02 MED ORDER — SODIUM CHLORIDE 0.9 % IV SOLN: INTRAVENOUS | Status: DC

## 2023-09-02 MED ORDER — MECLIZINE HCL 25 MG PO TABS
25.0000 mg | ORAL_TABLET | Freq: Three times a day (TID) | ORAL | Status: DC | PRN
  Administered 2023-09-02 – 2023-09-03 (×2): 25 mg via ORAL
  Filled 2023-09-02 (×3): qty 1

## 2023-09-02 MED ORDER — ENOXAPARIN SODIUM 60 MG/0.6ML IJ SOSY
0.5000 mg/kg | PREFILLED_SYRINGE | INTRAMUSCULAR | Status: DC
  Administered 2023-09-02: 60 mg via SUBCUTANEOUS
  Filled 2023-09-02: qty 0.6

## 2023-09-02 MED ORDER — MECLIZINE HCL 25 MG PO TABS
50.0000 mg | ORAL_TABLET | Freq: Once | ORAL | Status: AC
  Administered 2023-09-02: 50 mg via ORAL
  Filled 2023-09-02: qty 2

## 2023-09-02 NOTE — ED Notes (Signed)
No other neuro focal deficit besides sudden onset of dizziness from this morning.

## 2023-09-02 NOTE — H&P (Signed)
History and Physical    [name] GUR:[id number] DOB: 04-13-1961 DOA: 09/02/2023  Referring MD/NP/PA:   PCP: Jac Canavan, PA-C   Patient coming from:  The patient is coming from home.     Chief Complaint: dizziness  HPI: [name] is a 62 y.o. male with medical history significant of depression with anxiety, prostate cancer (s/p of prostatectomy), obesity, who presents with dizziness.  Patient states that his symptoms started in his early morning, which has been persistent.  Patient is feeling room spinning around him.  No fall.  No ear ringing or hearing loss.  Patient has horizontal nystagmus.  Patient's dizziness is aggravated by opening eyes, light and by turning his head to the left. No unilateral numbness or tingling in extremities.  No facial droop or slurred speech.  Patient has nausea, no vomiting, diarrhea or abdominal pain.  No symptoms of UTI.  Patient denies chest pain, cough, SOB.  No fever or chills.  Patient has nasal congestion, no sore throat.  Data reviewed independently and ED Course: pt was found to have WBC 3.9, GFR> 60, INR 1.1, PTT 26, temperature normal, blood pressure 159/110, heart rate 44 -->61, RR 20, oxygen saturation 100% on room air.  CT of head negative.  CT of C-spine negative for stroke.  CT of head and neck negative for LVO.  Patient is placed on telemetry bed for observation.  Dr. Wilford Corner of neurology is consulted by EDP.   EKG: I have personally reviewed.  Sinus rhythm, QTc 439, PAC.   Review of Systems:   General: no fevers, chills, no body weight gain, has fatigue HEENT: no blurry vision, hearing changes or sore throat Respiratory: no dyspnea, coughing, wheezing CV: no chest pain, no palpitations GI: has nausea, no vomiting, abdominal pain, diarrhea, constipation GU: no dysuria, burning on urination, increased urinary frequency, hematuria  Ext: no leg edema Neuro: no unilateral weakness, numbness, or tingling, no vision change  or hearing loss. Has dizziness Skin: no rash, no skin tear. MSK: No muscle spasm, no deformity, no limitation of range of movement in spin Heme: No easy bruising.  Travel history: No recent long distant travel.   Allergy: No Known Allergies  Past Medical History:  Diagnosis Date   Allergy    Anxiety    Cancer (HCC)    hx prostate ca 2008;  Dr. Heloise Purpura   Complication of anesthesia    took awhile to awaken surgery 2011 hernia    Past Surgical History:  Procedure Laterality Date   HERNIA REPAIR  2011   umbilical, left   INGUINAL HERNIA REPAIR Bilateral 01/11/2016   Procedure: LAPAROSCOPIC BILATERAL INGUINAL HERNIA REPAIR WITH MESH;  Surgeon: Karie Soda, MD;  Location: [facility] OR;  Service: General;  Laterality: Bilateral;   LYSIS OF ADHESION Bilateral 01/11/2016   Procedure: LYSIS OF ADHESION;  Surgeon: Karie Soda, MD;  Location: [facility] OR;  Service: General;  Laterality: Bilateral;   PROSTATE SURGERY  2008   cancer/ no chemo or radiation done    Social History:  reports that he has never smoked. He has never used smokeless tobacco. He reports that he does not drink alcohol and does not use drugs.  Family History:  Family History  Problem Relation Age of Onset   Hypertension Mother    Cancer Mother        breast   Stroke Father    Cancer Sister        thyroid   Kidney disease Maternal  Uncle    Cancer Maternal Uncle        prostate   Diabetes Neg Hx    Heart disease Neg Hx    Colon cancer Neg Hx    Rectal cancer Neg Hx    Stomach cancer Neg Hx      Prior to Admission medications   Medication Sig Start Date End Date Taking? Authorizing Provider  Ascorbic Acid (VITAMIN C) 1000 MG tablet Take 1,000 mg by mouth daily.    [provider]  aspirin EC 81 MG tablet Take 81 mg by mouth daily.    [provider]  b complex vitamins tablet Take 1 tablet by mouth daily.    [provider]  calcium-vitamin D (OSCAL WITH D) 250-125 MG-UNIT tablet Take  1 tablet by mouth daily.    [provider]  FLUoxetine (PROZAC) 20 MG capsule TAKE 1 CAPSULE BY MOUTH TWICE A DAY 06/28/19   Tysinger, Kermit Balo, PA-C  loratadine (CLARITIN) 10 MG tablet Take 10 mg by mouth daily.    [provider]  NON FORMULARY [facility] high blood pressure formala 10 mg-Take 2 daily    [provider]    Physical Exam: Vitals:   09/02/23 1621 09/02/23 1700 09/02/23 1823 09/02/23 2100  BP: (!) 164/95 (!) 169/92 (!) 159/110 (!) 159/97  Pulse: (!) 46 (!) 44 61 64  Resp: (!) 7 20 12 11   Temp: 98.2 F (36.8 C)  97.9 F (36.6 C)   TempSrc: Oral  Oral   SpO2: 100% 100% 100% 100%  Weight:      Height:       General: Not in acute distress HEENT:       Eyes: PERRL, EOMI, no jaundice       ENT: No discharge from the ears and nose, no pharynx injection, no tonsillar enlargement.        Neck: No JVD, no bruit, no mass felt. Heme: No neck lymph node enlargement. Cardiac: S1/S2, RRR, No murmurs, No gallops or rubs. Respiratory: No rales, wheezing, rhonchi or rubs. GI: Soft, nondistended, nontender, no rebound pain, no organomegaly, BS present. GU: No hematuria Ext: No pitting leg edema bilaterally. 1+DP/PT pulse bilaterally. Musculoskeletal: No joint deformities, No joint redness or warmth, no limitation of ROM in spin. Skin: No rashes.  Neuro: Alert, oriented X3, cranial nerves II-XII grossly intact except for horizontal nystagmus, moves all extremities normally. Muscle strength 5/5 in all extremities, sensation to light touch intact.  Psych: Patient is not psychotic, no suicidal or hemocidal ideation.  Labs on Admission: I have personally reviewed following labs and imaging studies  CBC: Recent Labs  Lab 09/02/23 1328  WBC 3.9*  NEUTROABS 2.6  HGB 15.0  HCT 44.4  MCV 90.8  PLT 308   Basic Metabolic Panel: Recent Labs  Lab 09/02/23 1328  NA 136  K 4.1  CL 106  CO2 20*  GLUCOSE 168*  BUN 14  CREATININE 0.80  CALCIUM 9.2    GFR: Estimated Creatinine Clearance: 132.6 mL/min (by C-G formula based on SCr of 0.8 mg/dL). Liver Function Tests: Recent Labs  Lab 09/02/23 1328  AST 19  ALT 21  ALKPHOS 56  BILITOT 0.8  PROT 7.2  ALBUMIN 4.4   No results for input(s): "LIPASE", "AMYLASE" in the last 168 hours. No results for input(s): "AMMONIA" in the last 168 hours. Coagulation Profile: Recent Labs  Lab 09/02/23 1328  INR 1.1   Cardiac Enzymes: No results for input(s): "CKTOTAL", "CKMB", "CKMBINDEX", "  TROPONINI" in the last 168 hours. BNP (last 3 results) No results for input(s): "PROBNP" in the last 8760 hours. HbA1C: No results for input(s): "HGBA1C" in the last 72 hours. CBG: No results for input(s): "GLUCAP" in the last 168 hours. Lipid Profile: No results for input(s): "CHOL", "HDL", "LDLCALC", "TRIG", "CHOLHDL", "LDLDIRECT" in the last 72 hours. Thyroid Function Tests: No results for input(s): "TSH", "T4TOTAL", "FREET4", "T3FREE", "THYROIDAB" in the last 72 hours. Anemia Panel: No results for input(s): "VITAMINB12", "FOLATE", "FERRITIN", "TIBC", "IRON", "RETICCTPCT" in the last 72 hours. Urine analysis:    Component Value Date/Time   COLORURINE YELLOW 08/25/2010 0944   APPEARANCEUR CLEAR 08/25/2010 0944   LABSPEC 1.030 04/16/2018 0944   PHURINE 6.0 08/25/2010 0944   GLUCOSEU NEGATIVE 08/25/2010 0944   HGBUR NEGATIVE 08/25/2010 0944   BILIRUBINUR negative 04/16/2018 0944   KETONESUR negative 04/16/2018 0944   KETONESUR NEGATIVE 08/25/2010 0944   PROTEINUR negative 04/16/2018 0944   PROTEINUR NEGATIVE 08/25/2010 0944   UROBILINOGEN 0.2 08/25/2010 0944   NITRITE Negative 04/16/2018 0944   NITRITE NEGATIVE 08/25/2010 0944   LEUKOCYTESUR Negative 04/16/2018 0944   Sepsis Labs: @LABRCNTIP (procalcitonin:4,lacticidven:4) )No results found for this or any previous visit (from the past 240 hour(s)).   Radiological Exams on Admission: MR BRAIN WO CONTRAST  Result Date:  09/02/2023 CLINICAL DATA:  Initial evaluation for neuro deficit, stroke suspected. EXAM: MRI HEAD WITHOUT CONTRAST TECHNIQUE: Multiplanar, multiecho pulse sequences of the brain and surrounding structures were obtained without intravenous contrast. COMPARISON:  Prior CTs from earlier the same day. FINDINGS: Brain: Cerebral volume within normal limits. Patchy and hazy T2/FLAIR hyperintensity involving the periventricular and deep white matter both cerebral hemispheres, most like related chronic microvascular ischemic disease, mild in nature. No evidence for acute or subacute ischemia. Gray-white matter differentiation maintained. No areas of chronic cortical infarction. No acute or chronic intracranial blood products. No mass lesion, midline shift or mass effect. No hydrocephalus or extra-axial fluid collection. Pituitary gland and suprasellar region within normal limits. Vascular: Major intracranial vascular flow voids are maintained. Skull and upper cervical spine: Craniocervical junction within normal limits. Bone marrow signal intensity normal. No scalp soft tissue abnormality. Sinuses/Orbits: Left gaze noted. Globes orbital soft tissues otherwise unremarkable. Paranasal sinuses are largely clear. No significant mastoid effusion. Other: None. IMPRESSION: 1. No acute intracranial abnormality. 2. Mild chronic microvascular ischemic disease for age. Electronically Signed   By: Rise Mu M.D.   On: 09/02/2023 20:21   CT ANGIO HEAD NECK W WO CM  Result Date: 09/02/2023 CLINICAL DATA:  Initial evaluation for acute stroke/TIA. EXAM: CT ANGIOGRAPHY HEAD AND NECK WITH AND WITHOUT CONTRAST TECHNIQUE: Multidetector CT imaging of the head and neck was performed using the standard protocol during bolus administration of intravenous contrast. Multiplanar CT image reconstructions and MIPs were obtained to evaluate the vascular anatomy. Carotid stenosis measurements (when applicable) are obtained utilizing NASCET  criteria, using the distal internal carotid diameter as the denominator. RADIATION DOSE REDUCTION: This exam was performed according to the departmental dose-optimization program which includes automated exposure control, adjustment of the mA and/or kV according to patient size and/or use of iterative reconstruction technique. CONTRAST:  75mL OMNIPAQUE IOHEXOL 350 MG/ML SOLN COMPARISON:  Prior CT from earlier the same day. FINDINGS: CTA NECK FINDINGS Aortic arch: Visualized aortic arch within normal limits for caliber with standard branch pattern. Mild aortic atherosclerosis. No stenosis about the origin the great vessels. Right carotid system: Right common and internal carotid arteries are patent without dissection. Mild atheromatous  change about the right carotid bulb without hemodynamically significant stenosis. No ulceration. Left carotid system: Left common and internal carotid arteries are patent without dissection. Mild atheromatous change about the left carotid bulb without hemodynamically significant greater than 50% stenosis. No ulceration. Vertebral arteries: Both vertebral arteries arise from subclavian arteries. No proximal subclavian artery stenosis. Atheromatous plaque at the origin of the right vertebral artery with moderate stenosis. Vertebral arteries patent distally without additional stenosis or dissection. Short-segment fenestration involving the distal right V2 segment noted. Skeleton: No discrete or worrisome osseous lesions. Moderate spondylosis present at C4-5 and C5-6. Other neck: No other acute finding. Upper chest: No other acute finding. Review of the MIP images confirms the above findings CTA HEAD FINDINGS Anterior circulation: Atheromatous change about the carotid siphons bilaterally. Associated moderate stenosis at the supraclinoid right ICA. No significant stenosis about the left siphon. A1 segments patent bilaterally. Normal anterior communicating artery complex. Anterior cerebral  arteries patent without stenosis. No M1 stenosis or occlusion. No proximal MCA branch occlusion or high-grade stenosis. Distal MCA branches perfused and symmetric. Distal small vessel atheromatous irregularity. Posterior circulation: Both V4 segments patent without stenosis. Right vertebral artery dominant. Left PICA patent. Right PICA origin not well seen. Basilar patent without stenosis. Superior cerebellar and posterior cerebral arteries patent bilaterally. Venous sinuses: Grossly patent allowing for timing the contrast bolus. Anatomic variants: As above.  No aneurysm. Review of the MIP images confirms the above findings IMPRESSION: 1. Negative CTA for large vessel occlusion or other emergent finding. 2. Atheromatous change about the carotid siphons with associated moderate stenosis at the supraclinoid right ICA. 3. Atheromatous change at the origin of the right vertebral artery with moderate stenosis. No other hemodynamically significant stenosis within the neck. 4.  Aortic Atherosclerosis (ICD10-I70.0). Electronically Signed   By: Rise Mu M.D.   On: 09/02/2023 19:42   CT HEAD WO CONTRAST  Result Date: 09/02/2023 CLINICAL DATA:  Presyncope.  Dizziness.  Photophobia.  Nystagmus. EXAM: CT HEAD WITHOUT CONTRAST TECHNIQUE: Contiguous axial images were obtained from the base of the skull through the vertex without intravenous contrast. RADIATION DOSE REDUCTION: This exam was performed according to the departmental dose-optimization program which includes automated exposure control, adjustment of the mA and/or kV according to patient size and/or use of iterative reconstruction technique. COMPARISON:  None Available. FINDINGS: Brain: No evidence of intracranial hemorrhage, acute infarction, hydrocephalus, extra-axial collection, or mass lesion/mass effect. Vascular:  No hyperdense vessel or other acute findings. Skull: No evidence of fracture or other significant bone abnormality. Sinuses/Orbits:  No  acute findings. Other: None. IMPRESSION: Negative. Electronically Signed   By: Danae Orleans M.D.   On: 09/02/2023 14:23      Assessment/Plan Principal Problem:   Vertigo Active Problems:   Depression with anxiety   Obesity (BMI 30.0-34.9)   Assessment and Plan:  Vertigo: MRI of brain negative for stroke. CTA of head and neck negative for LVO. EDP consulted Dr. Wilford Corner of neuro, suspecting vestibular neuritis. Other differential diagnosis include Mnire's disease (less likely given no tinnitus and hearing loss), benign paroxysmal positional vertigo and drug induced vertigo.   -Patient will be place in tele bed for obs -Fall precaution -Give 125 mg of Solu-Medrol in ED -As needed meclizine -NS IV 100 cc/h  -check UDS -Vestibular PT is ordered  Depression with anxiety: Patient is not taking medications currently. -Zoloft closely  Obesity (BMI 30.0-34.9): Body weight 117.9 kg, BMI 32.50 -Encourage losing weight -Exercise and healthy diet    DVT ppx:  SQ Lovenox  Code Status: Full code    Family Communication:  Yes, patient's wife    at bed side.     Disposition Plan:  Anticipate discharge back to previous environment  Consults called:  Dr. Wilford Corner of neurology is consulted by EDP.  Admission status and Level of care: Telemetry Medical: for obs     Dispo: The patient is from: Home              Anticipated d/c is to: Home              Anticipated d/c date is: 1 day              Patient currently is not medically stable to d/c.    Severity of Illness:  The appropriate patient status for this patient is OBSERVATION. Observation status is judged to be reasonable and necessary in order to provide the required intensity of service to ensure the patient's safety. The patient's presenting symptoms, physical exam findings, and initial radiographic and laboratory data in the context of their medical condition is felt to place them at decreased risk for further clinical  deterioration. Furthermore, it is anticipated that the patient will be medically stable for discharge from the hospital within 2 midnights of admission.        Date of Service 09/02/2023    Lorretta Harp [facility]   If 7PM-7AM, please contact night-coverage [email or web address] 09/02/2023, 10:16 PM

## 2023-09-02 NOTE — ED Provider Notes (Signed)
[facility] Provider Note   Event Date/Time   First MD Initiated Contact with Patient 09/02/23 1553     (approximate) History  Dizziness  HPI [name] is a 62 y.o. male with a past medical history depression/anxiety, hyperglycemia, and hypertension not on medications who presents complaining of vertigo that began at 6:30 AM as well as sensitivity to light.  EMS noted nystagmus during transport.  Patient was reportedly diaphoretic in triage however not right now.  Patient states he is having mild nausea and continues to have the sensation of the room spinning around him when he opens his eyes and especially when he turns his head to the left ROS: Patient currently denies any tinnitus, difficulty speaking, facial droop, sore throat, chest pain, shortness of breath, abdominal pain, nausea/vomiting/diarrhea, dysuria, or weakness/numbness/paresthesias in any extremity   Physical Exam  Triage Vital Signs: ED Triage Vitals  Encounter Vitals Group     BP 09/02/23 1314 (!) 156/104     Systolic BP Percentile --      Diastolic BP Percentile --      Pulse Rate 09/02/23 1314 (!) 56     Resp 09/02/23 1314 18     Temp 09/02/23 1314 (!) 96.2 F (35.7 C)     Temp Source 09/02/23 1314 Axillary     SpO2 09/02/23 1314 98 %     Weight 09/02/23 1311 260 lb (117.9 kg)     Height 09/02/23 1311 6\' 3"  (1.905 m)     Head Circumference --      Peak Flow --      Pain Score 09/02/23 1310 0     Pain Loc --      Pain Education --      Exclude from Growth Chart --    Most recent vital signs: Vitals:   09/02/23 1823 09/02/23 2100  BP: (!) 159/110 (!) 159/97  Pulse: 61 64  Resp: 12 11  Temp: 97.9 F (36.6 C)   SpO2: 100% 100%   General: Awake, oriented x4. CV:  Good peripheral perfusion.  Resp:  Normal effort.  Abd:  No distention.  Other:  Horizontal nystagmus bilaterally that does not extinguish. No corrective saccade on head impulse test. Resting comfortably in no  acute distress ED Results / Procedures / Treatments  Labs (all labs ordered are listed, but only abnormal results are displayed) Labs Reviewed  CBC - Abnormal; Notable for the following components:      Result Value   WBC 3.9 (*)    All other components within normal limits  COMPREHENSIVE METABOLIC PANEL - Abnormal; Notable for the following components:   CO2 20 (*)    Glucose, Bld 168 (*)    All other components within normal limits  PROTIME-INR  APTT  DIFFERENTIAL  ETHANOL  URINE DRUG SCREEN, QUALITATIVE ([facility] ONLY)  HIV ANTIBODY (ROUTINE TESTING W REFLEX)  CBC  CBG MONITORING, ED   EKG ED ECG REPORT I, Merwyn Katos, the attending physician, personally viewed and interpreted this ECG. Date: 09/02/2023 EKG Time: 1325 Rate: 57 Rhythm: normal sinus rhythm QRS Axis: normal Intervals: normal ST/T Wave abnormalities: normal Narrative Interpretation: no evidence of acute ischemia RADIOLOGY ED MD interpretation: CT of the head without contrast interpreted by me shows no evidence of acute abnormalities including no intracerebral hemorrhage, obvious masses, or significant edema -Agree with radiology assessment Official radiology report(s): MR BRAIN WO CONTRAST  Result Date: 09/02/2023 CLINICAL DATA:  Initial evaluation for neuro deficit, stroke  suspected. EXAM: MRI HEAD WITHOUT CONTRAST TECHNIQUE: Multiplanar, multiecho pulse sequences of the brain and surrounding structures were obtained without intravenous contrast. COMPARISON:  Prior CTs from earlier the same day. FINDINGS: Brain: Cerebral volume within normal limits. Patchy and hazy T2/FLAIR hyperintensity involving the periventricular and deep white matter both cerebral hemispheres, most like related chronic microvascular ischemic disease, mild in nature. No evidence for acute or subacute ischemia. Gray-white matter differentiation maintained. No areas of chronic cortical infarction. No acute or chronic intracranial blood  products. No mass lesion, midline shift or mass effect. No hydrocephalus or extra-axial fluid collection. Pituitary gland and suprasellar region within normal limits. Vascular: Major intracranial vascular flow voids are maintained. Skull and upper cervical spine: Craniocervical junction within normal limits. Bone marrow signal intensity normal. No scalp soft tissue abnormality. Sinuses/Orbits: Left gaze noted. Globes orbital soft tissues otherwise unremarkable. Paranasal sinuses are largely clear. No significant mastoid effusion. Other: None. IMPRESSION: 1. No acute intracranial abnormality. 2. Mild chronic microvascular ischemic disease for age. Electronically Signed   By: Rise Mu M.D.   On: 09/02/2023 20:21   CT ANGIO HEAD NECK W WO CM  Result Date: 09/02/2023 CLINICAL DATA:  Initial evaluation for acute stroke/TIA. EXAM: CT ANGIOGRAPHY HEAD AND NECK WITH AND WITHOUT CONTRAST TECHNIQUE: Multidetector CT imaging of the head and neck was performed using the standard protocol during bolus administration of intravenous contrast. Multiplanar CT image reconstructions and MIPs were obtained to evaluate the vascular anatomy. Carotid stenosis measurements (when applicable) are obtained utilizing NASCET criteria, using the distal internal carotid diameter as the denominator. RADIATION DOSE REDUCTION: This exam was performed according to the departmental dose-optimization program which includes automated exposure control, adjustment of the mA and/or kV according to patient size and/or use of iterative reconstruction technique. CONTRAST:  75mL OMNIPAQUE IOHEXOL 350 MG/ML SOLN COMPARISON:  Prior CT from earlier the same day. FINDINGS: CTA NECK FINDINGS Aortic arch: Visualized aortic arch within normal limits for caliber with standard branch pattern. Mild aortic atherosclerosis. No stenosis about the origin the great vessels. Right carotid system: Right common and internal carotid arteries are patent without  dissection. Mild atheromatous change about the right carotid bulb without hemodynamically significant stenosis. No ulceration. Left carotid system: Left common and internal carotid arteries are patent without dissection. Mild atheromatous change about the left carotid bulb without hemodynamically significant greater than 50% stenosis. No ulceration. Vertebral arteries: Both vertebral arteries arise from subclavian arteries. No proximal subclavian artery stenosis. Atheromatous plaque at the origin of the right vertebral artery with moderate stenosis. Vertebral arteries patent distally without additional stenosis or dissection. Short-segment fenestration involving the distal right V2 segment noted. Skeleton: No discrete or worrisome osseous lesions. Moderate spondylosis present at C4-5 and C5-6. Other neck: No other acute finding. Upper chest: No other acute finding. Review of the MIP images confirms the above findings CTA HEAD FINDINGS Anterior circulation: Atheromatous change about the carotid siphons bilaterally. Associated moderate stenosis at the supraclinoid right ICA. No significant stenosis about the left siphon. A1 segments patent bilaterally. Normal anterior communicating artery complex. Anterior cerebral arteries patent without stenosis. No M1 stenosis or occlusion. No proximal MCA branch occlusion or high-grade stenosis. Distal MCA branches perfused and symmetric. Distal small vessel atheromatous irregularity. Posterior circulation: Both V4 segments patent without stenosis. Right vertebral artery dominant. Left PICA patent. Right PICA origin not well seen. Basilar patent without stenosis. Superior cerebellar and posterior cerebral arteries patent bilaterally. Venous sinuses: Grossly patent allowing for timing the contrast bolus. Anatomic variants:  As above.  No aneurysm. Review of the MIP images confirms the above findings IMPRESSION: 1. Negative CTA for large vessel occlusion or other emergent finding. 2.  Atheromatous change about the carotid siphons with associated moderate stenosis at the supraclinoid right ICA. 3. Atheromatous change at the origin of the right vertebral artery with moderate stenosis. No other hemodynamically significant stenosis within the neck. 4.  Aortic Atherosclerosis (ICD10-I70.0). Electronically Signed   By: Rise Mu M.D.   On: 09/02/2023 19:42   CT HEAD WO CONTRAST  Result Date: 09/02/2023 CLINICAL DATA:  Presyncope.  Dizziness.  Photophobia.  Nystagmus. EXAM: CT HEAD WITHOUT CONTRAST TECHNIQUE: Contiguous axial images were obtained from the base of the skull through the vertex without intravenous contrast. RADIATION DOSE REDUCTION: This exam was performed according to the departmental dose-optimization program which includes automated exposure control, adjustment of the mA and/or kV according to patient size and/or use of iterative reconstruction technique. COMPARISON:  None Available. FINDINGS: Brain: No evidence of intracranial hemorrhage, acute infarction, hydrocephalus, extra-axial collection, or mass lesion/mass effect. Vascular:  No hyperdense vessel or other acute findings. Skull: No evidence of fracture or other significant bone abnormality. Sinuses/Orbits:  No acute findings. Other: None. IMPRESSION: Negative. Electronically Signed   By: Danae Orleans M.D.   On: 09/02/2023 14:23   PROCEDURES: Critical Care performed: No .1-3 Lead EKG Interpretation  Performed by: Merwyn Katos, MD Authorized by: Merwyn Katos, MD     Interpretation: normal     ECG rate:  71   ECG rate assessment: normal     Rhythm: sinus rhythm     Ectopy: none     Conduction: normal    MEDICATIONS ORDERED IN ED: Medications  0.9 %  sodium chloride infusion ( Intravenous New Bag/Given 09/02/23 2201)  meclizine (ANTIVERT) tablet 25 mg (25 mg Oral Given 09/02/23 2145)  enoxaparin (LOVENOX) injection 60 mg (60 mg Subcutaneous Given 09/02/23 2144)  meclizine (ANTIVERT) tablet 50 mg  (50 mg Oral Given 09/02/23 1620)  ondansetron (ZOFRAN) injection 4 mg (4 mg Intravenous Given 09/02/23 1819)  iohexol (OMNIPAQUE) 350 MG/ML injection 75 mL (75 mLs Intravenous Contrast Given 09/02/23 1906)  methylPREDNISolone sodium succinate (SOLU-MEDROL) 125 mg/2 mL injection 125 mg (125 mg Intravenous Given 09/02/23 2145)   IMPRESSION / MDM / ASSESSMENT AND PLAN / ED COURSE  I reviewed the triage vital signs and the nursing notes.                             The patient is on the cardiac monitor to evaluate for evidence of arrhythmia and/or significant heart rate changes. Patient's presentation is most consistent with acute presentation with potential threat to life or bodily function. Patient is 62 year old male that presents for persistent dizziness, nausea, and generalized weakness that began suddenly at 6 AM this morning.  Differential diagnosis includes but is not limited to posterior CVA/TIA, BPPV, vestibulitis, Mnire's disease, electrolyte abnormality, arrhythmia  CT, CTA, and MRI of the brain showed no evidence of CVA/TIA Patient treated with meclizine upon arrival with limited improvement in patient's vertigo, patient was given Zofran for nausea control and nausea improved.  Patient's vertigo however is still significant and he is unable to ambulate without significant difficulty.  Per recommendations of Dr. Jerrell Belfast and neurology will trial a course of steroids however patient will require admission to the internal medicine service  Dispo: Admit to medicine   FINAL CLINICAL IMPRESSION(S) / ED DIAGNOSES  Final diagnoses:  Vertigo  Ambulatory dysfunction   Rx / DC Orders   ED Discharge Orders     None      Note:  This document was prepared using Dragon voice recognition software and may include unintentional dictation errors.   Merwyn Katos, MD 09/02/23 [phone]

## 2023-09-02 NOTE — Consult Note (Signed)
PHARMACIST - PHYSICIAN COMMUNICATION  CONCERNING:  Enoxaparin (Lovenox) for DVT Prophylaxis    RECOMMENDATION: Patient was prescribed enoxaprin 40mg  q24 hours for VTE prophylaxis.   Filed Weights   09/02/23 1311  Weight: 117.9 kg (260 lb)    Body mass index is 32.5 kg/m.  Estimated Creatinine Clearance: 132.6 mL/min (by C-G formula based on SCr of 0.8 mg/dL).   Based on [facility] policy patient is candidate for enoxaparin 0.5mg /kg TBW SQ every 24 hours based on BMI being >30.  DESCRIPTION: Pharmacy has adjusted enoxaparin dose per [facility] policy.  Patient is now receiving enoxaparin 60 mg every 24 hours    Celene Squibb, PharmD Clinical Pharmacist  09/02/2023 9:09 PM

## 2023-09-02 NOTE — ED Notes (Signed)
Not yet in room.

## 2023-09-02 NOTE — ED Triage Notes (Signed)
Pt from home via [facility]. Called for dizziness that started at 6:30 am, light sensitivity, no H/A. Slight nystagmus per EMS. Pt diaphoretic in triage, has blanket over head to block light.  Irregular HR with PVC, no medication or medical history EMS vitals: BP 160/90 CBG 149 98% RA

## 2023-09-03 DIAGNOSIS — R42 Dizziness and giddiness: Secondary | ICD-10-CM

## 2023-09-03 DIAGNOSIS — R262 Difficulty in walking, not elsewhere classified: Secondary | ICD-10-CM

## 2023-09-03 DIAGNOSIS — E66811 Obesity, class 1: Secondary | ICD-10-CM

## 2023-09-03 DIAGNOSIS — F418 Other specified anxiety disorders: Secondary | ICD-10-CM | POA: Diagnosis not present

## 2023-09-03 LAB — CBC
HCT: 42 % (ref 39.0–52.0)
Hemoglobin: 15 g/dL (ref 13.0–17.0)
MCH: 30.8 pg (ref 26.0–34.0)
MCHC: 35.7 g/dL (ref 30.0–36.0)
MCV: 86.2 fL (ref 80.0–100.0)
Platelets: 313 10*3/uL (ref 150–400)
RBC: 4.87 MIL/uL (ref 4.22–5.81)
RDW: 12.4 % (ref 11.5–15.5)
WBC: 6.6 10*3/uL (ref 4.0–10.5)
nRBC: 0 % (ref 0.0–0.2)

## 2023-09-03 LAB — URINE DRUG SCREEN, QUALITATIVE (ARMC ONLY)
Amphetamines, Ur Screen: NOT DETECTED
Barbiturates, Ur Screen: NOT DETECTED
Benzodiazepine, Ur Scrn: NOT DETECTED
Cannabinoid 50 Ng, Ur ~~LOC~~: POSITIVE — AB
Cocaine Metabolite,Ur ~~LOC~~: NOT DETECTED
MDMA (Ecstasy)Ur Screen: NOT DETECTED
Methadone Scn, Ur: NOT DETECTED
Opiate, Ur Screen: NOT DETECTED
Phencyclidine (PCP) Ur S: NOT DETECTED
Tricyclic, Ur Screen: NOT DETECTED

## 2023-09-03 LAB — HIV ANTIBODY (ROUTINE TESTING W REFLEX): HIV Screen 4th Generation wRfx: NONREACTIVE

## 2023-09-03 MED ORDER — SALINE SPRAY 0.65 % NA SOLN: 1.0000 | NASAL | 0 refills | Status: AC | PRN

## 2023-09-03 MED ORDER — PSEUDOEPHEDRINE HCL ER 120 MG PO TB12: 120.0000 mg | ORAL_TABLET | Freq: Two times a day (BID) | ORAL | 0 refills | Status: AC

## 2023-09-03 MED ORDER — LOSARTAN POTASSIUM 25 MG PO TABS
25.0000 mg | ORAL_TABLET | Freq: Every day | ORAL | Status: DC
  Administered 2023-09-03: 25 mg via ORAL
  Filled 2023-09-03: qty 1

## 2023-09-03 MED ORDER — SALINE SPRAY 0.65 % NA SOLN
1.0000 | NASAL | Status: DC | PRN
  Filled 2023-09-03 (×2): qty 44

## 2023-09-03 MED ORDER — LOSARTAN POTASSIUM 25 MG PO TABS: 25.0000 mg | ORAL_TABLET | Freq: Every day | ORAL | 1 refills | Status: AC

## 2023-09-03 MED ORDER — PSEUDOEPHEDRINE HCL ER 120 MG PO TB12
120.0000 mg | ORAL_TABLET | Freq: Two times a day (BID) | ORAL | Status: DC
  Administered 2023-09-03: 120 mg via ORAL
  Filled 2023-09-03: qty 1

## 2023-09-03 MED ORDER — FLUTICASONE PROPIONATE 50 MCG/ACT NA SUSP
2.0000 | Freq: Every day | NASAL | Status: DC
  Administered 2023-09-03: 2 via NASAL
  Filled 2023-09-03: qty 16

## 2023-09-03 MED ORDER — FLUTICASONE PROPIONATE 50 MCG/ACT NA SUSP: 2.0000 | Freq: Every day | NASAL | 2 refills | Status: AC

## 2023-09-03 MED ORDER — MECLIZINE HCL 25 MG PO TABS: 25.0000 mg | ORAL_TABLET | Freq: Three times a day (TID) | ORAL | 0 refills | Status: AC | PRN

## 2023-09-03 MED ORDER — MECLIZINE HCL 25 MG PO TABS
25.0000 mg | ORAL_TABLET | Freq: Three times a day (TID) | ORAL | Status: DC
  Administered 2023-09-03: 25 mg via ORAL
  Filled 2023-09-03 (×2): qty 1

## 2023-09-03 MED ORDER — LORATADINE 10 MG PO TABS
10.0000 mg | ORAL_TABLET | Freq: Every day | ORAL | Status: DC
  Administered 2023-09-03: 10 mg via ORAL
  Filled 2023-09-03: qty 1

## 2023-09-03 NOTE — Evaluation (Signed)
Physical Therapy Evaluation Patient Details Name: [name] MRN: [id number] DOB: 1961/10/07 Today's Date: 09/03/2023  History of Present Illness  Pt is a 62 y/o male admitted 09/02/23 for vertigo. Pt has expereinced s/sx relative to dizziness, horizontal nystagmus, as well as sensitivity to light and positional changes. PmHx includes: prostate cancer, depression and anxiety.   Clinical Impression  Pt received in bed with lights off and agreed to PT session. Pt reports dizziness and response to sunlight has improved as they are laying down in bed. Pt performed bed mobility IND and sat EOB where dizziness and nystagmus occurred. Pt laid back in bed due to dizziness where dizziness then subsided. Pt presented with dizziness during horizontal/vertical VOMS assessment as well as nystagmus with convergence and extraocular motility assessment "H in space". Lights were turned on as pt was in bed where s/sx were not exacerbated. Pt then sat at EOB again with lights on where s/sx relative to dizziness began again, pt had to lay back down where dizziness then subsided. Pt reports that this has happened to him before when he was young and has not occurred again until now. Pt vitals at the beginning of the session read: 176/121 mmHg and 75 bpm while seated EOB. Pt vitals at the end of session read: 176/117 mmHg and 67 bpm while laying down. Pt did not tolerate Tx well today and will continue to benefit from skilled PT sessions to improve activity tolerance as well as maximize safety and return to PLOF.      If plan is discharge home, recommend the following: Other (comment) (S/Sx free with mobility and positional changes)   Can travel by private vehicle        Equipment Recommendations None recommended by PT  Recommendations for Other Services       Functional Status Assessment Patient has had a recent decline in their functional status and demonstrates the ability to make significant improvements in  function in a reasonable and predictable amount of time.     Precautions / Restrictions Precautions Precautions: Fall Restrictions Weight Bearing Restrictions: No      Mobility  Bed Mobility Overal bed mobility: Independent             General bed mobility comments: Pt performs bed mobility IND. When seated at EOB, pt reports presence of dizziness and sinus pressure. When laying back down in bed, pt reports s/sx decreas.    Transfers                   General transfer comment: deferred due to episodes of severe dizziness    Ambulation/Gait               General Gait Details: deferred  Stairs            Wheelchair Mobility     Tilt Bed    Modified Rankin (Stroke Patients Only)       Balance Overall balance assessment: Needs assistance Sitting-balance support: Feet supported Sitting balance-Leahy Scale: Good         Standing balance comment: deferred                             Pertinent Vitals/Pain Pain Assessment Pain Assessment: Faces Faces Pain Scale: Hurts little more Pain Location: Not specifically pain. Pt endures dizziness. Pain Descriptors / Indicators: Discomfort, Other (Comment) (Dizziness) Pain Intervention(s): Monitored during session, Limited activity within patient's tolerance, Repositioned  Home Living Family/patient expects to be discharged to:: Private residence Living Arrangements: Spouse/significant other Available Help at Discharge: Family;Available 24 hours/day Type of Home: Other(Comment) (Town House)       Alternate Teacher, music of Steps: Flight of stairs per level Home Layout: Multi-level Home Equipment: Other (comment) (Unsure if he still has RW at home) Additional Comments: Pt primarily resides in basement/ "man cave" where there is a full bathroom and bed. To get to the basement, there is a flight of stairs where the hand rail is on the right side.    Prior Function Prior Level  of Function : Independent/Modified Independent             Mobility Comments: Pt reports IND prior to admission ADLs Comments: Pt reports IND prior to admission     Extremity/Trunk Assessment   Upper Extremity Assessment Upper Extremity Assessment: Overall WFL for tasks assessed    Lower Extremity Assessment Lower Extremity Assessment: Overall WFL for tasks assessed       Communication   Communication Communication: No apparent difficulties Cueing Techniques: Verbal cues  Cognition Arousal: Alert Behavior During Therapy: WFL for tasks assessed/performed Overall Cognitive Status: Within Functional Limits for tasks assessed                                 General Comments: AOx4. Pt pleasant and willing to participate in PT session.        General Comments General comments (skin integrity, edema, etc.): Pt presented with continued high BP 176/121 mmHg at the start of session as pt was seated at EOB.    Exercises     Assessment/Plan    PT Assessment Patient needs continued PT services  PT Problem List Decreased activity tolerance;Decreased balance;Decreased mobility       PT Treatment Interventions Balance training    PT Goals (Current goals can be found in the Care Plan section)  Acute Rehab PT Goals Patient Stated Goal: To get better PT Goal Formulation: With patient Time For Goal Achievement: 09/17/23 Potential to Achieve Goals: Good    Frequency Min 1X/week     Co-evaluation               AM-PAC PT "6 Clicks" Mobility  Outcome Measure Help needed turning from your back to your side while in a flat bed without using bedrails?: None Help needed moving from lying on your back to sitting on the side of a flat bed without using bedrails?: None Help needed moving to and from a bed to a chair (including a wheelchair)?: A Little Help needed standing up from a chair using your arms (e.g., wheelchair or bedside chair)?: A Little Help  needed to walk in hospital room?: A Little Help needed climbing 3-5 steps with a railing? : A Little 6 Click Score: 20    End of Session Equipment Utilized During Treatment: Gait belt Activity Tolerance: Other (comment) (Pt limited by dizziness) Patient left: in bed;with call bell/phone within reach Nurse Communication: Mobility status PT Visit Diagnosis: Unsteadiness on feet (R26.81);Other symptoms and signs involving the nervous system (R29.898);Dizziness and giddiness (R42)    Time: 6962-9528 PT Time Calculation (min) (ACUTE ONLY): 32 min   Charges:   PT Evaluation $PT Eval Low Complexity: 1 Low PT Treatments $Therapeutic Activity: 8-22 mins PT General Charges $$ ACUTE PT VISIT: 1 Visit         [name], [name], [name]  [name] 09/03/2023, 12:44 PM

## 2023-09-03 NOTE — Hospital Course (Addendum)
Taken from H&P.  [name] is a 62 y.o. male with medical history significant of depression with anxiety, prostate cancer (s/p of prostatectomy), obesity, who presents with dizziness, with sensation of room spinning for 1 day.  No ear ringing or hearing loss.  Patient has horizontal nystagmus, symptoms aggravated by opening eyes, light and by turning head to the left.  No focal deficit.  On presentation patient has mildly elevated blood pressure at 159/110, rest of the vitals and labs stable.  CT head was negative.  CT cervical spine negative.  CTA of head and neck negative for LVO.  EKG NSR, QTc 439, PAC.  11/3: Blood pressure elevated at 171/106, MRI brain negative for acute infarct.  Neurology is consulted from ED day.  UDS positive for cannabinoid.  Patient has chronic allergies with sinus congestion which might be causing vestibular neuritis.  No nasal discharge or concern of infection.  Patient was started on Claritin, Sudafed, Flonase and saline nasal spray to help with those symptoms.  Meclizine seems helping so he can continue up to 3 times a day as needed. History of similar prior episodes which normally improves within days.  PT also evaluated him with no outpatient recommendations.  Patient was started on losartan and advised to follow-up closely with PCP so they can monitor and titrate the dose.  Patient was more interested taking outpatient [employer] supplement for high blood pressure-education was provided to him and wife on phone.  Patient otherwise stable for discharge.  He will continue on current medications and need to have a close follow-up with primary care provider for further management.

## 2023-09-03 NOTE — Discharge Summary (Signed)
Physician Discharge Summary   Patient: [name] MRN: [id number] DOB: 19-Jul-1961  Admit date:     09/02/2023  Discharge date: 09/03/23  Discharge Physician: Arnetha Courser   PCP: Jac Canavan, PA-C   Recommendations at discharge:  Please obtain CBC and BMP on follow-up Follow-up with primary care provider within a week  Discharge Diagnoses: Principal Problem:   Vertigo Active Problems:   Depression with anxiety   Obesity (BMI 30.0-34.9)   Ambulatory dysfunction   Hospital Course: Taken from H&P.  [name] is a 62 y.o. male with medical history significant of depression with anxiety, prostate cancer (s/p of prostatectomy), obesity, who presents with dizziness, with sensation of room spinning for 1 day.  No ear ringing or hearing loss.  Patient has horizontal nystagmus, symptoms aggravated by opening eyes, light and by turning head to the left.  No focal deficit.  On presentation patient has mildly elevated blood pressure at 159/110, rest of the vitals and labs stable.  CT head was negative.  CT cervical spine negative.  CTA of head and neck negative for LVO.  EKG NSR, QTc 439, PAC.  11/3: Blood pressure elevated at 171/106, MRI brain negative for acute infarct.  Neurology is consulted from ED day.  UDS positive for cannabinoid.  Patient has chronic allergies with sinus congestion which might be causing vestibular neuritis.  No nasal discharge or concern of infection.  Patient was started on Claritin, Sudafed, Flonase and saline nasal spray to help with those symptoms.  Meclizine seems helping so he can continue up to 3 times a day as needed. History of similar prior episodes which normally improves within days.  PT also evaluated him with no outpatient recommendations.  Patient was started on losartan and advised to follow-up closely with PCP so they can monitor and titrate the dose.  Patient was more interested taking outpatient [employer] supplement for high blood  pressure-education was provided to him and wife on phone.  Patient otherwise stable for discharge.  He will continue on current medications and need to have a close follow-up with primary care provider for further management.   Consultants: None Procedures performed: None Disposition: Home Diet recommendation:  Discharge Diet Orders (From admission, onward)     Start     Ordered   09/03/23 0000  Diet - low sodium heart healthy        09/03/23 1230           Cardiac diet DISCHARGE MEDICATION: Allergies as of 09/03/2023   No Known Allergies      Medication List     TAKE these medications    aspirin EC 81 MG tablet Take 81 mg by mouth daily.   b complex vitamins tablet Take 1 tablet by mouth daily.   calcium-vitamin D 250-125 MG-UNIT tablet Commonly known as: OSCAL WITH D Take 1 tablet by mouth daily.   FLUoxetine 20 MG capsule Commonly known as: PROZAC TAKE 1 CAPSULE BY MOUTH TWICE A DAY   fluticasone 50 MCG/ACT nasal spray Commonly known as: FLONASE Place 2 sprays into both nostrils daily. Start taking on: September 04, 2023   loratadine 10 MG tablet Commonly known as: CLARITIN Take 10 mg by mouth daily.   losartan 25 MG tablet Commonly known as: COZAAR Take 1 tablet (25 mg total) by mouth daily. Start taking on: September 04, 2023   meclizine 25 MG tablet Commonly known as: ANTIVERT Take 1 tablet (25 mg total) by mouth 3 (three) times daily  as needed for dizziness.   multivitamin with minerals Tabs tablet Take 1 tablet by mouth daily.   NON FORMULARY GNC high blood pressure formala 10 mg-Take 2 daily   pseudoephedrine 120 MG 12 hr tablet Commonly known as: SUDAFED Take 1 tablet (120 mg total) by mouth 2 (two) times daily.   sodium chloride 0.65 % Soln nasal spray Commonly known as: OCEAN Place 1 spray into both nostrils as needed for congestion.   vitamin C 1000 MG tablet Take 1,000 mg by mouth daily.        Follow-up Information      Tysinger, Kermit Balo, PA-C. Schedule an appointment as soon as possible for a visit in 1 week(s).   Specialty: Family Medicine Contact information: [address] [place] [address] [phone]                Discharge Exam: Ceasar Mons Weights   09/02/23 1311 09/03/23 0022  Weight: 117.9 kg 115.6 kg   General.  Obese gentleman, in no acute distress. Pulmonary.  Lungs clear bilaterally, normal respiratory effort. CV.  Regular rate and rhythm, no JVD, rub or murmur. Abdomen.  Soft, nontender, nondistended, BS positive. CNS.  Alert and oriented .  No focal neurologic deficit. Extremities.  No edema, no cyanosis, pulses intact and symmetrical. Psychiatry.  Judgment and insight appears normal.   Condition at discharge: stable  The results of significant diagnostics from this hospitalization (including imaging, microbiology, ancillary and laboratory) are listed below for reference.   Imaging Studies: MR BRAIN WO CONTRAST  Result Date: 09/02/2023 CLINICAL DATA:  Initial evaluation for neuro deficit, stroke suspected. EXAM: MRI HEAD WITHOUT CONTRAST TECHNIQUE: Multiplanar, multiecho pulse sequences of the brain and surrounding structures were obtained without intravenous contrast. COMPARISON:  Prior CTs from earlier the same day. FINDINGS: Brain: Cerebral volume within normal limits. Patchy and hazy T2/FLAIR hyperintensity involving the periventricular and deep white matter both cerebral hemispheres, most like related chronic microvascular ischemic disease, mild in nature. No evidence for acute or subacute ischemia. Gray-white matter differentiation maintained. No areas of chronic cortical infarction. No acute or chronic intracranial blood products. No mass lesion, midline shift or mass effect. No hydrocephalus or extra-axial fluid collection. Pituitary gland and suprasellar region within normal limits. Vascular: Major intracranial vascular flow voids are maintained. Skull and upper  cervical spine: Craniocervical junction within normal limits. Bone marrow signal intensity normal. No scalp soft tissue abnormality. Sinuses/Orbits: Left gaze noted. Globes orbital soft tissues otherwise unremarkable. Paranasal sinuses are largely clear. No significant mastoid effusion. Other: None. IMPRESSION: 1. No acute intracranial abnormality. 2. Mild chronic microvascular ischemic disease for age. Electronically Signed   By: Rise Mu M.D.   On: 09/02/2023 20:21   CT ANGIO HEAD NECK W WO CM  Result Date: 09/02/2023 CLINICAL DATA:  Initial evaluation for acute stroke/TIA. EXAM: CT ANGIOGRAPHY HEAD AND NECK WITH AND WITHOUT CONTRAST TECHNIQUE: Multidetector CT imaging of the head and neck was performed using the standard protocol during bolus administration of intravenous contrast. Multiplanar CT image reconstructions and MIPs were obtained to evaluate the vascular anatomy. Carotid stenosis measurements (when applicable) are obtained utilizing NASCET criteria, using the distal internal carotid diameter as the denominator. RADIATION DOSE REDUCTION: This exam was performed according to the departmental dose-optimization program which includes automated exposure control, adjustment of the mA and/or kV according to patient size and/or use of iterative reconstruction technique. CONTRAST:  75mL OMNIPAQUE IOHEXOL 350 MG/ML SOLN COMPARISON:  Prior CT from earlier the same day. FINDINGS:  CTA NECK FINDINGS Aortic arch: Visualized aortic arch within normal limits for caliber with standard branch pattern. Mild aortic atherosclerosis. No stenosis about the origin the great vessels. Right carotid system: Right common and internal carotid arteries are patent without dissection. Mild atheromatous change about the right carotid bulb without hemodynamically significant stenosis. No ulceration. Left carotid system: Left common and internal carotid arteries are patent without dissection. Mild atheromatous change  about the left carotid bulb without hemodynamically significant greater than 50% stenosis. No ulceration. Vertebral arteries: Both vertebral arteries arise from subclavian arteries. No proximal subclavian artery stenosis. Atheromatous plaque at the origin of the right vertebral artery with moderate stenosis. Vertebral arteries patent distally without additional stenosis or dissection. Short-segment fenestration involving the distal right V2 segment noted. Skeleton: No discrete or worrisome osseous lesions. Moderate spondylosis present at C4-5 and C5-6. Other neck: No other acute finding. Upper chest: No other acute finding. Review of the MIP images confirms the above findings CTA HEAD FINDINGS Anterior circulation: Atheromatous change about the carotid siphons bilaterally. Associated moderate stenosis at the supraclinoid right ICA. No significant stenosis about the left siphon. A1 segments patent bilaterally. Normal anterior communicating artery complex. Anterior cerebral arteries patent without stenosis. No M1 stenosis or occlusion. No proximal MCA branch occlusion or high-grade stenosis. Distal MCA branches perfused and symmetric. Distal small vessel atheromatous irregularity. Posterior circulation: Both V4 segments patent without stenosis. Right vertebral artery dominant. Left PICA patent. Right PICA origin not well seen. Basilar patent without stenosis. Superior cerebellar and posterior cerebral arteries patent bilaterally. Venous sinuses: Grossly patent allowing for timing the contrast bolus. Anatomic variants: As above.  No aneurysm. Review of the MIP images confirms the above findings IMPRESSION: 1. Negative CTA for large vessel occlusion or other emergent finding. 2. Atheromatous change about the carotid siphons with associated moderate stenosis at the supraclinoid right ICA. 3. Atheromatous change at the origin of the right vertebral artery with moderate stenosis. No other hemodynamically significant  stenosis within the neck. 4.  Aortic Atherosclerosis (ICD10-I70.0). Electronically Signed   By: Rise Mu M.D.   On: 09/02/2023 19:42   CT HEAD WO CONTRAST  Result Date: 09/02/2023 CLINICAL DATA:  Presyncope.  Dizziness.  Photophobia.  Nystagmus. EXAM: CT HEAD WITHOUT CONTRAST TECHNIQUE: Contiguous axial images were obtained from the base of the skull through the vertex without intravenous contrast. RADIATION DOSE REDUCTION: This exam was performed according to the departmental dose-optimization program which includes automated exposure control, adjustment of the mA and/or kV according to patient size and/or use of iterative reconstruction technique. COMPARISON:  None Available. FINDINGS: Brain: No evidence of intracranial hemorrhage, acute infarction, hydrocephalus, extra-axial collection, or mass lesion/mass effect. Vascular:  No hyperdense vessel or other acute findings. Skull: No evidence of fracture or other significant bone abnormality. Sinuses/Orbits:  No acute findings. Other: None. IMPRESSION: Negative. Electronically Signed   By: Danae Orleans M.D.   On: 09/02/2023 14:23    Microbiology: Results for orders placed or performed during the hospital encounter of 08/30/10  Surgical pcr screen     Status: None   Collection Time: 08/25/10  9:43 AM  Result Value Ref Range Status   MRSA, PCR NEGATIVE NEGATIVE Final   Staphylococcus aureus  NEGATIVE Final    NEGATIVE        The Xpert SA Assay (FDA approved for NASAL specimens only), is one component of a comprehensive surveillance program.  It is not intended to diagnose infection nor to guide or monitor treatment.  Labs: CBC: Recent Labs  Lab 09/02/23 1328 09/03/23 0540  WBC 3.9* 6.6  NEUTROABS 2.6  --   HGB 15.0 15.0  HCT 44.4 42.0  MCV 90.8 86.2  PLT 308 313   Basic Metabolic Panel: Recent Labs  Lab 09/02/23 1328  NA 136  K 4.1  CL 106  CO2 20*  GLUCOSE 168*  BUN 14  CREATININE 0.80  CALCIUM 9.2   Liver  Function Tests: Recent Labs  Lab 09/02/23 1328  AST 19  ALT 21  ALKPHOS 56  BILITOT 0.8  PROT 7.2  ALBUMIN 4.4   CBG: No results for input(s): "GLUCAP" in the last 168 hours.  Discharge time spent: greater than 30 minutes.  This record has been created using Conservation officer, historic buildings. Errors have been sought and corrected,but may not always be located. Such creation errors do not reflect on the standard of care.   Signed: Arnetha Courser, MD [facility] 09/03/2023

## 2023-09-03 NOTE — Plan of Care (Signed)

## 2023-09-05 ENCOUNTER — Encounter: Payer: Self-pay | Admitting: Cardiology

## 2023-09-13 ENCOUNTER — Ambulatory Visit: Payer: Commercial Managed Care - PPO | Admitting: Cardiology

## 2023-10-31 ENCOUNTER — Ambulatory Visit: Payer: Commercial Managed Care - PPO | Admitting: Cardiology

## 2023-11-14 ENCOUNTER — Encounter (INDEPENDENT_AMBULATORY_CARE_PROVIDER_SITE_OTHER): Payer: Self-pay | Admitting: Vascular Surgery

## 2024-03-19 ENCOUNTER — Encounter (INDEPENDENT_AMBULATORY_CARE_PROVIDER_SITE_OTHER): Payer: Self-pay
